# Patient Record
Sex: Female | Born: 1955 | Race: White | Hispanic: No | Marital: Married | Smoking: Never smoker
Health system: Southern US, Community
[De-identification: ages and names within clinical notes are randomized; demographics above are authoritative.]

## PROBLEM LIST (undated history)

## (undated) DIAGNOSIS — D649 Anemia, unspecified: Secondary | ICD-10-CM

## (undated) DIAGNOSIS — K219 Gastro-esophageal reflux disease without esophagitis: Secondary | ICD-10-CM

## (undated) DIAGNOSIS — R011 Cardiac murmur, unspecified: Secondary | ICD-10-CM

## (undated) DIAGNOSIS — C801 Malignant (primary) neoplasm, unspecified: Secondary | ICD-10-CM

## (undated) DIAGNOSIS — K635 Polyp of colon: Secondary | ICD-10-CM

## (undated) DIAGNOSIS — S143XXA Injury of brachial plexus, initial encounter: Secondary | ICD-10-CM

## (undated) HISTORY — DX: Cardiac murmur, unspecified: R01.1

## (undated) HISTORY — DX: Polyp of colon: K63.5

## (undated) HISTORY — DX: Malignant (primary) neoplasm, unspecified: C80.1

## (undated) HISTORY — DX: Gastro-esophageal reflux disease without esophagitis: K21.9

---
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
## 1983-04-16 HISTORY — PX: CARPAL TUNNEL RELEASE: SHX101

## 1984-04-15 HISTORY — PX: TUBAL LIGATION: SHX77

## 2005-08-08 ENCOUNTER — Ambulatory Visit: Payer: Self-pay

## 2007-03-22 ENCOUNTER — Ambulatory Visit: Payer: Self-pay | Admitting: Internal Medicine

## 2011-04-30 ENCOUNTER — Ambulatory Visit: Payer: Self-pay | Admitting: Internal Medicine

## 2011-05-02 LAB — BETA STREP CULTURE(ARMC)

## 2012-01-29 ENCOUNTER — Ambulatory Visit: Payer: Self-pay | Admitting: Family Medicine

## 2012-01-29 LAB — URINALYSIS, COMPLETE
Bilirubin,UR: NEGATIVE
Glucose,UR: NEGATIVE mg/dL (ref 0–75)
Nitrite: POSITIVE
Protein: NEGATIVE
Specific Gravity: 1.01 (ref 1.003–1.030)
WBC UR: >30 /HPF (ref 0–5)

## 2012-01-31 LAB — URINE CULTURE

## 2012-04-15 DIAGNOSIS — D649 Anemia, unspecified: Secondary | ICD-10-CM

## 2012-04-15 HISTORY — DX: Anemia, unspecified: D64.9

## 2014-12-26 ENCOUNTER — Other Ambulatory Visit: Payer: Self-pay | Admitting: Nurse Practitioner

## 2014-12-26 DIAGNOSIS — R159 Full incontinence of feces: Secondary | ICD-10-CM

## 2014-12-26 DIAGNOSIS — K6289 Other specified diseases of anus and rectum: Secondary | ICD-10-CM

## 2014-12-26 DIAGNOSIS — K625 Hemorrhage of anus and rectum: Secondary | ICD-10-CM

## 2014-12-29 ENCOUNTER — Ambulatory Visit: Payer: Self-pay

## 2015-01-04 ENCOUNTER — Ambulatory Visit: Admission: RE | Admit: 2015-01-04 | Payer: Self-pay | Source: Ambulatory Visit | Admitting: Gastroenterology

## 2015-01-04 ENCOUNTER — Encounter: Admission: RE | Payer: Self-pay | Source: Ambulatory Visit

## 2015-01-04 SURGERY — COLONOSCOPY
Anesthesia: Choice

## 2015-01-09 ENCOUNTER — Ambulatory Visit
Admission: RE | Admit: 2015-01-09 | Discharge: 2015-01-09 | Disposition: A | Discharge status: Home / Self Care | Payer: BC Managed Care – PPO | Source: Ambulatory Visit | Attending: Nurse Practitioner | Admitting: Nurse Practitioner

## 2015-01-09 DIAGNOSIS — R159 Full incontinence of feces: Secondary | ICD-10-CM

## 2015-01-09 DIAGNOSIS — K6289 Other specified diseases of anus and rectum: Secondary | ICD-10-CM | POA: Insufficient documentation

## 2015-01-09 DIAGNOSIS — K5730 Diverticulosis of large intestine without perforation or abscess without bleeding: Secondary | ICD-10-CM | POA: Diagnosis not present

## 2015-01-09 DIAGNOSIS — K625 Hemorrhage of anus and rectum: Secondary | ICD-10-CM

## 2015-01-09 IMAGING — CT CT ABD-PELV W/ CM
2 of 5 series · 16 of 46 positions shown, 18 images · IV contrast (omnipaque)
Comparison: None.

CLINICAL DATA: Rectal bleeding for 9 months. Anal pain. Fecal
incontinence.

EXAM:
CT ABDOMEN AND PELVIS WITH CONTRAST
TECHNIQUE: Multidetector CT imaging of the abdomen and pelvis was performed
using the standard protocol following bolus administration of
intravenous contrast.
CONTRAST:  100mL OMNIPAQUE IOHEXOL 350 MG/ML SOLN

[Series 2: routine with · axial · 0.70mm/px · z∈[-959,-544]mm · 13 of 95 slices shown, 15 images]
[im 6/95  soft-tissue]
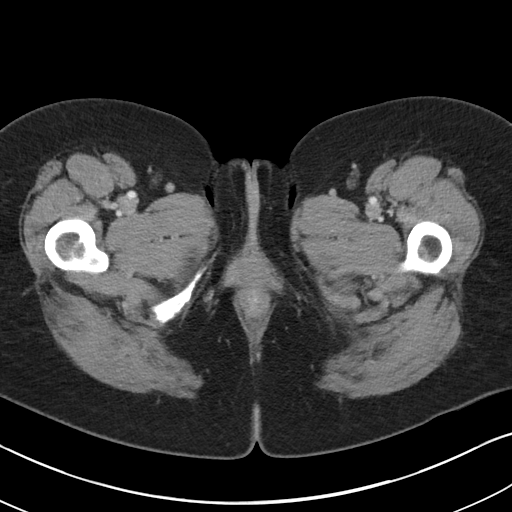
[im 6/95  bone]
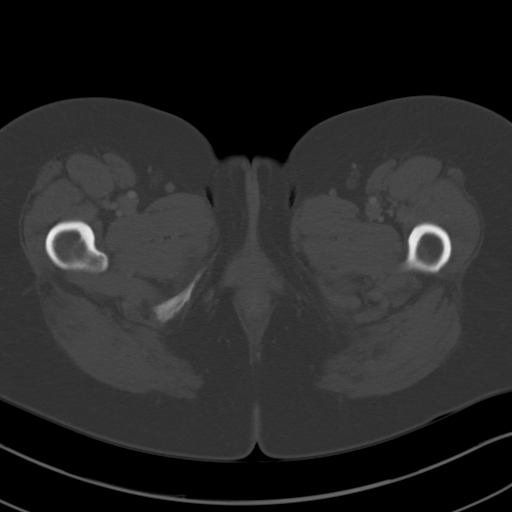
[im 11/95  soft-tissue]
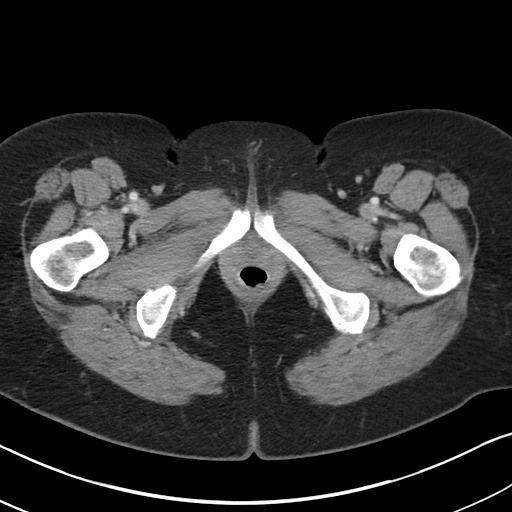
[im 21/95  soft-tissue]
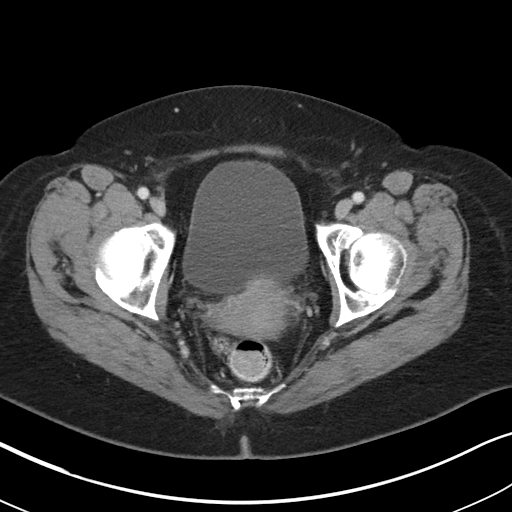
[im 27/95  soft-tissue]
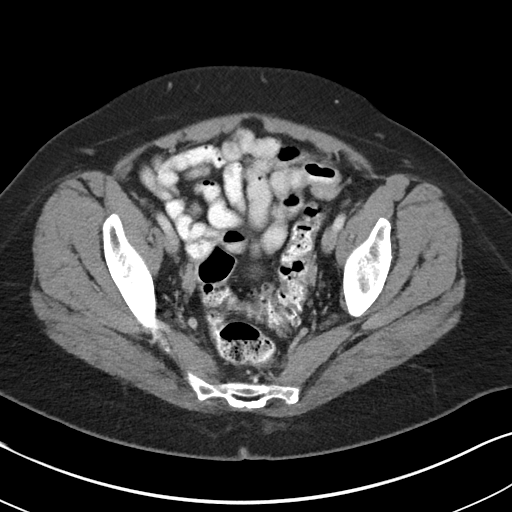
[im 32/95  soft-tissue]
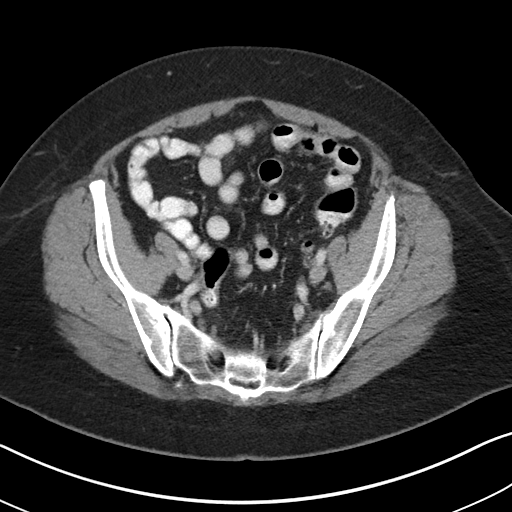
[im 42/95  soft-tissue]
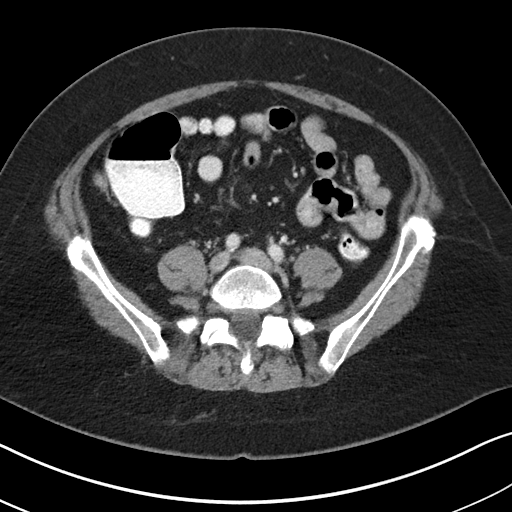
[im 48/95  soft-tissue]
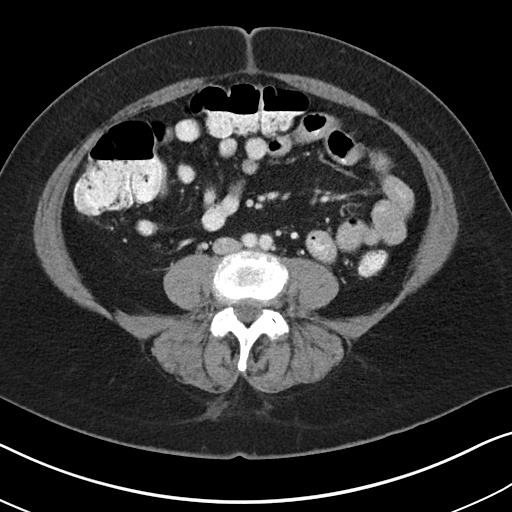
[im 53/95  soft-tissue]
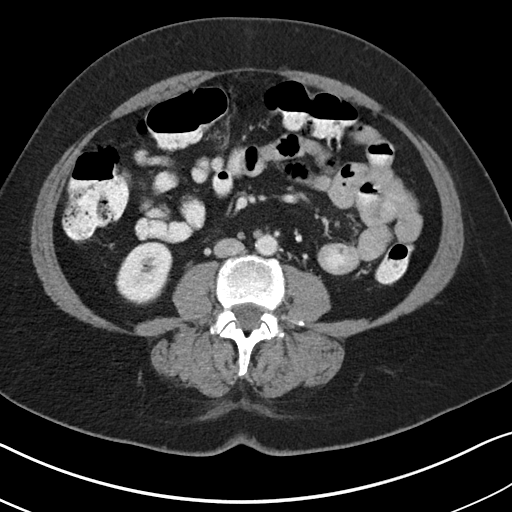
[im 63/95  soft-tissue]
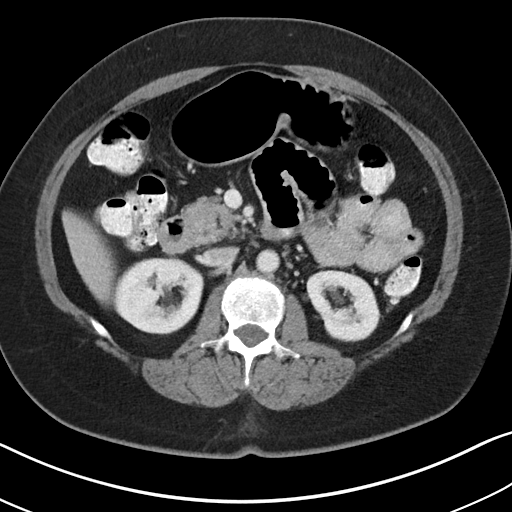
[im 63/95  bone]
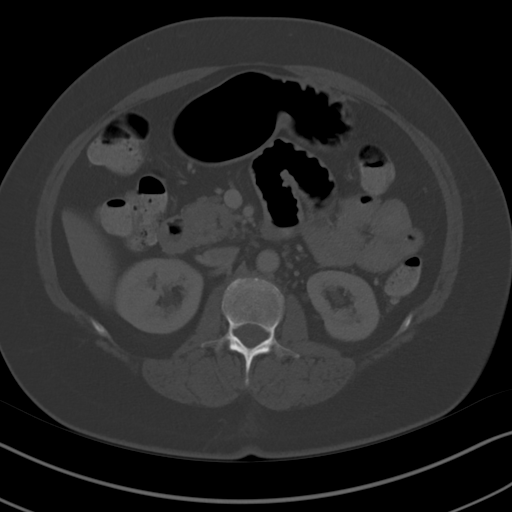
[im 68/95  soft-tissue]
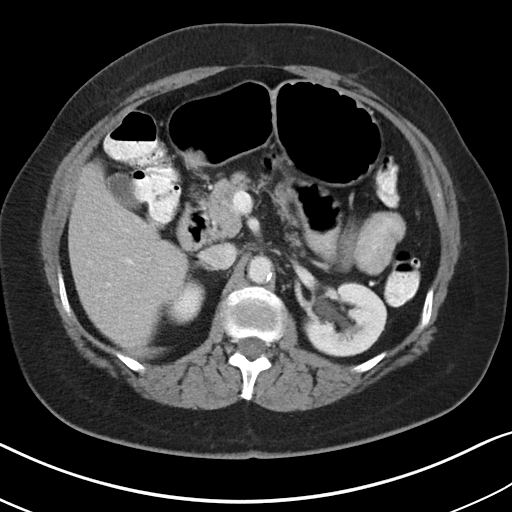
[im 74/95  soft-tissue]
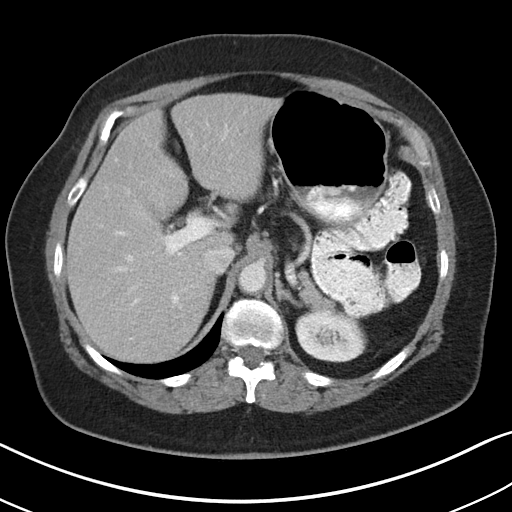
[im 84/95  soft-tissue]
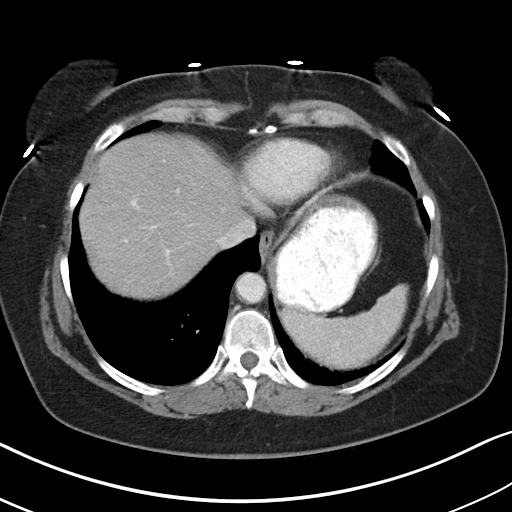
[im 89/95  soft-tissue]
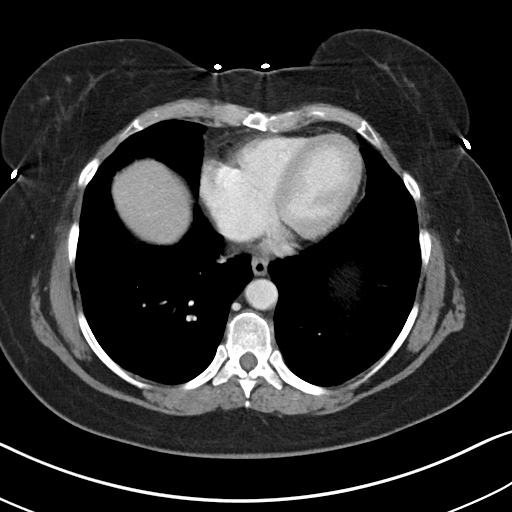

[Series 6: cor routine with · coronal · 0.74mm/px · 3 of 165 slices shown]
[im 55/165  soft-tissue]
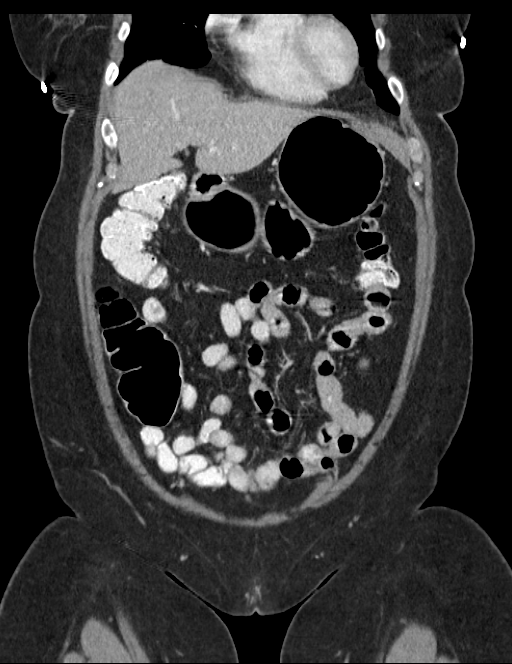
[im 73/165  soft-tissue]
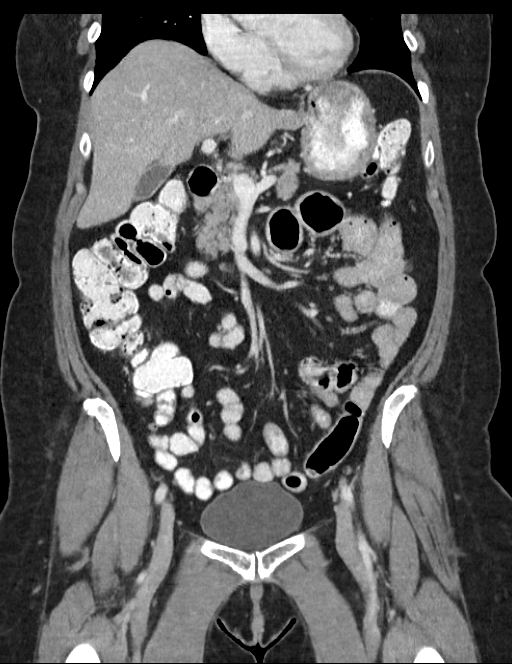
[im 92/165  soft-tissue]
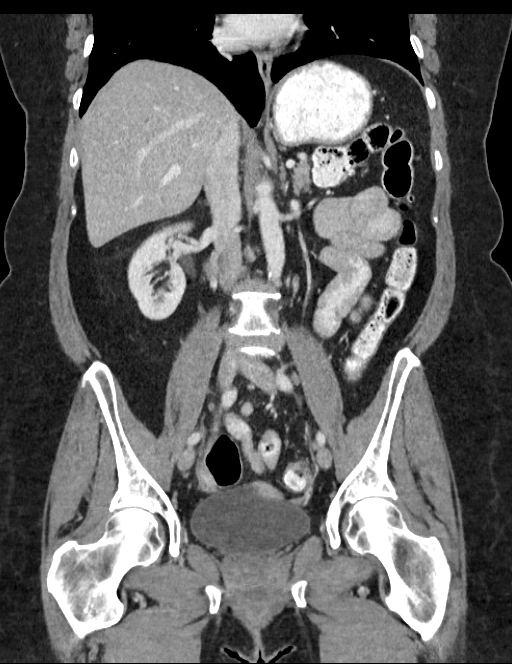

[16 of 46 positions shown; findings below may reference images not displayed]

FINDINGS: Lower chest:  No acute findings.

Hepatobiliary: No masses or other significant abnormality.
Gallbladder is unremarkable.

Pancreas: No mass, inflammatory changes, or other significant
abnormality.

Spleen: Within normal limits in size and appearance.

Adrenals/Urinary Tract: No masses identified. No evidence of
hydronephrosis.

Stomach/Bowel: No evidence of obstruction, inflammatory process, or
abnormal fluid collections. Normal appendix is visualized.
Diverticulosis is seen involving the descending and sigmoid colon,
however there is no evidence of diverticulitis. No evidence of
perirectal, perianal, or pelvic floor inflammatory changes or fluid
collections.

Vascular/Lymphatic: No pathologically enlarged lymph nodes. No
evidence of abdominal aortic aneurysm.

Reproductive: No mass or other significant abnormality.

Other: None.

Musculoskeletal:  No suspicious bone lesions identified.
IMPRESSION: Colonic diverticulosis. No radiographic evidence of diverticulitis
or other acute findings. No inflammatory changes or abnormal fluid
collections seen in perirectal or perianal regions.

## 2015-01-09 MED ORDER — IOHEXOL 350 MG/ML SOLN
100.0000 mL | Freq: Once | INTRAVENOUS | Status: AC | PRN
  Administered 2015-01-09: 100 mL via INTRAVENOUS

## 2015-04-16 DIAGNOSIS — C801 Malignant (primary) neoplasm, unspecified: Secondary | ICD-10-CM

## 2015-04-16 DIAGNOSIS — K635 Polyp of colon: Secondary | ICD-10-CM

## 2015-04-16 HISTORY — PX: OTHER SURGICAL HISTORY: SHX169

## 2015-04-16 HISTORY — DX: Polyp of colon: K63.5

## 2015-04-16 HISTORY — PX: COLON SURGERY: SHX602

## 2015-04-16 HISTORY — DX: Malignant (primary) neoplasm, unspecified: C80.1

## 2015-12-01 ENCOUNTER — Encounter: Payer: Self-pay | Admitting: *Deleted

## 2015-12-04 ENCOUNTER — Encounter
Admission: RE | Disposition: A | Discharge status: Home Health | Payer: Self-pay | Source: Ambulatory Visit | Attending: Gastroenterology

## 2015-12-04 ENCOUNTER — Encounter: Payer: Self-pay | Admitting: *Deleted

## 2015-12-04 ENCOUNTER — Ambulatory Visit: Payer: BC Managed Care – PPO | Admitting: Anesthesiology

## 2015-12-04 ENCOUNTER — Ambulatory Visit
Admission: RE | Admit: 2015-12-04 | Discharge: 2015-12-04 | Disposition: A | Discharge status: Home Health | Payer: BC Managed Care – PPO | Source: Ambulatory Visit | Attending: Gastroenterology | Admitting: Gastroenterology

## 2015-12-04 DIAGNOSIS — K625 Hemorrhage of anus and rectum: Secondary | ICD-10-CM | POA: Diagnosis present

## 2015-12-04 DIAGNOSIS — Z79899 Other long term (current) drug therapy: Secondary | ICD-10-CM | POA: Diagnosis not present

## 2015-12-04 DIAGNOSIS — R194 Change in bowel habit: Secondary | ICD-10-CM | POA: Insufficient documentation

## 2015-12-04 DIAGNOSIS — K6289 Other specified diseases of anus and rectum: Secondary | ICD-10-CM | POA: Diagnosis not present

## 2015-12-04 DIAGNOSIS — R159 Full incontinence of feces: Secondary | ICD-10-CM | POA: Diagnosis present

## 2015-12-04 DIAGNOSIS — Z87828 Personal history of other (healed) physical injury and trauma: Secondary | ICD-10-CM | POA: Diagnosis not present

## 2015-12-04 DIAGNOSIS — Z86010 Personal history of colonic polyps: Secondary | ICD-10-CM | POA: Insufficient documentation

## 2015-12-04 DIAGNOSIS — D128 Benign neoplasm of rectum: Secondary | ICD-10-CM | POA: Diagnosis not present

## 2015-12-04 HISTORY — DX: Anemia, unspecified: D64.9

## 2015-12-04 HISTORY — DX: Injury of brachial plexus, initial encounter: S14.3XXA

## 2015-12-04 HISTORY — PX: COLONOSCOPY WITH PROPOFOL: SHX5780

## 2015-12-04 SURGERY — COLONOSCOPY WITH PROPOFOL
Anesthesia: General

## 2015-12-04 MED ORDER — SODIUM CHLORIDE 0.9 % IV SOLN: INTRAVENOUS | Status: DC

## 2015-12-04 MED ORDER — PROPOFOL 500 MG/50ML IV EMUL
INTRAVENOUS | Status: DC | PRN
  Administered 2015-12-04: 150 ug/kg/min via INTRAVENOUS

## 2015-12-04 MED ORDER — SODIUM CHLORIDE 0.9 % IV SOLN
INTRAVENOUS | Status: DC
  Administered 2015-12-04: 1000 mL via INTRAVENOUS

## 2015-12-04 MED ORDER — PROPOFOL 10 MG/ML IV BOLUS
INTRAVENOUS | Status: DC | PRN
  Administered 2015-12-04: 30 mg via INTRAVENOUS
  Administered 2015-12-04: 80 mg via INTRAVENOUS

## 2015-12-04 MED ORDER — LIDOCAINE HCL (CARDIAC) 20 MG/ML IV SOLN
INTRAVENOUS | Status: DC | PRN
  Administered 2015-12-04: 30 mg via INTRAVENOUS

## 2015-12-04 NOTE — Transfer of Care (Signed)
Immediate Anesthesia Transfer of Care Note  Patient: Tonya Randolph  Procedure(s) Performed: Procedure(s): COLONOSCOPY WITH PROPOFOL (N/A)  Patient Location: Endoscopy Unit  Anesthesia Type:General  Level of Consciousness: awake and alert   Airway & Oxygen Therapy: Patient Spontanous Breathing and Patient connected to nasal cannula oxygen  Post-op Assessment: Report given to RN  Post vital signs: Reviewed and stable  Last Vitals:  Vitals:   12/04/15 0936  BP: 125/69  Pulse: 78  Resp: (!) 24  Temp: 37.1 C    Last Pain:  Vitals:   12/04/15 0936  TempSrc: Tympanic         Complications: No apparent anesthesia complications

## 2015-12-04 NOTE — Anesthesia Postprocedure Evaluation (Signed)
Anesthesia Post Note  Patient: Tonya Randolph  Procedure(s) Performed: Procedure(s) (LRB): COLONOSCOPY WITH PROPOFOL (N/A)  Patient location during evaluation: Endoscopy Anesthesia Type: General Level of consciousness: awake and alert Pain management: pain level controlled Vital Signs Assessment: post-procedure vital signs reviewed and stable Respiratory status: spontaneous breathing, nonlabored ventilation, respiratory function stable and patient connected to nasal cannula oxygen Cardiovascular status: blood pressure returned to baseline and stable Postop Assessment: no signs of nausea or vomiting Anesthetic complications: no    Last Vitals:  Vitals:   12/04/15 1158 12/04/15 1208  BP: 132/83 128/87  Pulse: 68 68  Resp: 11 17  Temp:      Last Pain:  Vitals:   12/04/15 1137  TempSrc: Tympanic                 Martha Clan

## 2015-12-04 NOTE — H&P (Signed)
Outpatient short stay form Pre-procedure 12/04/2015 10:44 AM Lollie Sails MD  Primary Physician: Dr. Thereasa Distance  Reason for visit:  Colonoscopy  History of present illness:  Patient is a 59 year old female presenting today for her first colonoscopy. He takes no blood thinning agents or aspirin products. She tolerated her prep well. She is been having issues with rectal bleeding as well as rectal discomfort and pain that may actually waken her up. This is been happening for the past 3-9 months getting worse with time. There is also been some fecal incontinence mostly of a watery nature.  Patient has had no children and has had no distal sacral injury.    Current Facility-Administered Medications:  .  0.9 %  sodium chloride infusion, , Intravenous, Continuous, Lollie Sails, MD, Last Rate: 20 mL/hr at 12/04/15 0956, 1,000 mL at 12/04/15 0956 .  0.9 %  sodium chloride infusion, , Intravenous, Continuous, Lollie Sails, MD  Prescriptions Prior to Admission  Medication Sig Dispense Refill Last Dose  . cetirizine (ZYRTEC) 10 MG tablet Take 10 mg by mouth daily.     . polyethylene glycol-electrolytes (NULYTELY/GOLYTELY) 420 g solution Take 4,000 mLs by mouth once.     . [DISCONTINUED] albuterol (PROVENTIL HFA;VENTOLIN HFA) 108 (90 Base) MCG/ACT inhaler Inhale 2 puffs into the lungs every 6 (six) hours as needed for wheezing or shortness of breath.        No Known Allergies   Past Medical History:  Diagnosis Date  . Anemia   . Brachial plexus injury     Review of systems:      Physical Exam    Heart and lungs: Regular rate and rhythm without rub or gallop, lungs are bilaterally clear.    HEENT: Normocephalic atraumatic eyes are anicteric    Other:     Pertinant exam for procedure: Soft nontender nondistended bowel sounds positive normoactive.    Planned proceedures: Colonoscopy and indicated procedures. I have discussed the risks benefits and complications  of procedures to include not limited to bleeding, infection, perforation and the risk of sedation and the patient wishes to proceed.    Lollie Sails, MD Gastroenterology 12/04/2015  10:44 AM

## 2015-12-04 NOTE — Op Note (Signed)
[facility] Patient Name: Tonya Randolph Procedure Date: 12/04/2015 10:51 AM MRN: [id number] Account #: [id number] Date of Birth: [birth year] Admit Type: Outpatient Age: 60 Room: [facility] ENDO ROOM 4 Gender: Female Note Status: Finalized Procedure:            Colonoscopy Indications:          Rectal bleeding, Change in bowel habits, Rectal pain Providers:            Lollie Sails, MD Referring MD:         [name] (Referring MD) Medicines:            Monitored Anesthesia Care Complications:        No immediate complications. Procedure:            Pre-Anesthesia Assessment:                       - ASA Grade Assessment: I - A normal, healthy patient.                       After obtaining informed consent, the colonoscope was                        passed under direct vision. Throughout the procedure,                        the patient's blood pressure, pulse, and oxygen                        saturations were monitored continuously. The                        Colonoscope was introduced through the anus and                        advanced to the the cecum, identified by appendiceal                        orifice and ileocecal valve. The colonoscopy was                        performed without difficulty. The patient tolerated the                        procedure well. The quality of the bowel preparation                        was good. Findings:      A polypoid partially obstructing large mass was found at 15 cm proximal       to the anus. The mass measured four cm in length. Lesion is friable. It       was relatively difficult to pass the lesion with the scope. The base was       very diffucult to ascertain but seemed broad with multiple larger       vessels. Biopsies were taken with a cold forceps for histology.      The digital rectal exam was normal. Impression:           - Rule out malignancy, partially obstructing tumor at  15 cm proximal to the anus. Biopsied. Recommendation:       - Discharge patient to home.                       - Await pathology results. Lollie Sails, MD 12/04/2015 11:40:22 AM This report has been signed electronically. Number of Addenda: 0 Note Initiated On: 12/04/2015 10:51 AM Scope Withdrawal Time: 0 hours 11 minutes 38 seconds  Total Procedure Duration: 0 hours 25 minutes 2 seconds       [facility]

## 2015-12-04 NOTE — Anesthesia Preprocedure Evaluation (Signed)
Anesthesia Evaluation  Patient identified by MRN, date of birth, ID band Patient awake    Reviewed: Allergy & Precautions, H&P , NPO status , Patient's Chart, lab work & pertinent test results, reviewed documented beta blocker date and time   History of Anesthesia Complications Negative for: history of anesthetic complications  Airway Mallampati: III  TM Distance: >3 FB Neck ROM: full    Dental no notable dental hx. (+) Caps, Teeth Intact   Pulmonary neg pulmonary ROS,    Pulmonary exam normal breath sounds clear to auscultation       Cardiovascular Exercise Tolerance: Good negative cardio ROS Normal cardiovascular exam Rhythm:regular Rate:Normal     Neuro/Psych neg Seizures  Neuromuscular disease (brachial plexus injury) negative psych ROS   GI/Hepatic negative GI ROS, Neg liver ROS,   Endo/Other  negative endocrine ROS  Renal/GU negative Renal ROS  negative genitourinary   Musculoskeletal   Abdominal   Peds  Hematology  (+) Blood dyscrasia, anemia ,   Anesthesia Other Findings Past Medical History: No date: Anemia No date: Brachial plexus injury   Reproductive/Obstetrics negative OB ROS                             Anesthesia Physical Anesthesia Plan  ASA: I  Anesthesia Plan: General   Post-op Pain Management:    Induction:   Airway Management Planned:   Additional Equipment:   Intra-op Plan:   Post-operative Plan:   Informed Consent: I have reviewed the patients History and Physical, chart, labs and discussed the procedure including the risks, benefits and alternatives for the proposed anesthesia with the patient or authorized representative who has indicated his/her understanding and acceptance.   Dental Advisory Given  Plan Discussed with: Anesthesiologist, CRNA and Surgeon  Anesthesia Plan Comments:         Anesthesia Quick Evaluation

## 2015-12-05 ENCOUNTER — Encounter: Payer: Self-pay | Admitting: Gastroenterology

## 2015-12-05 LAB — SURGICAL PATHOLOGY

## 2015-12-13 ENCOUNTER — Encounter: Payer: Self-pay | Admitting: *Deleted

## 2015-12-21 ENCOUNTER — Ambulatory Visit (INDEPENDENT_AMBULATORY_CARE_PROVIDER_SITE_OTHER): Payer: BC Managed Care – PPO | Admitting: General Surgery

## 2015-12-21 ENCOUNTER — Encounter: Payer: Self-pay | Admitting: General Surgery

## 2015-12-21 VITALS — BP 134/72 | HR 76 | Resp 12 | Ht 65.0 in | Wt 168.0 lb

## 2015-12-21 DIAGNOSIS — K635 Polyp of colon: Secondary | ICD-10-CM | POA: Diagnosis not present

## 2015-12-21 DIAGNOSIS — Z86010 Personal history of colonic polyps: Secondary | ICD-10-CM | POA: Diagnosis not present

## 2015-12-21 NOTE — Patient Instructions (Addendum)
Flexible Sigmoidoscopy Flexible sigmoidoscopy is a procedure to check your lower colon (sigmoid colon). The procedure is done using a short, flexible tube that has a tiny camera attached (sigmoidoscope). The sigmoidoscope is inserted into your anus and passed through your rectum into your sigmoid colon. The camera on the scope sends images to a television monitor.  You may need this exam if you have had changes in your bowel habits, bleeding from your rectum, or abdominal pain. You may also need this test if your health care provider wants to check for abnormal growths in your rectum or lower colon. LET [facility] CARE PROVIDER KNOW ABOUT:  Any allergies you have.  All medicines you are taking, including vitamins, herbs, eye drops, creams, and over-the-counter medicines.  Previous problems you or members of your family have had with the use of anesthetics.  Any blood disorders you have.  Previous surgeries you have had.  Medical conditions you have. RISKS AND COMPLICATIONS Generally, this is a safe procedure. However, as with any procedure, problems can occur. Possible problems include:  Abdominal pain.  Bleeding.  Infection or inflammation in your colon.  A tear through your rectum or colon (perforation). BEFORE THE PROCEDURE  You will need to follow a special diet and use a laxative or an enema before the procedure (bowel prep). This is to clean out your colon. You may need to start your bowel prep 1-3 days before the procedure. Follow your health care provider's instructions exactly.  Do not eat or drink anything after midnight the night before the procedure or as directed by your health care provider.  You may need to have a rectal suppository or enema in the morning before your procedure.  Arrange for someone to take you home after the procedure. PROCEDURE  Sigmoidoscopy takes about 20 minutes and may include the following steps:  You may get a medicine through an IV tube  to make you relaxed and drowsy (sedation).  You will lie on your side on the examination table.  The sigmoidoscope will be lubricated and gently inserted into your anus.  Air will be injected into your colon and the sigmoidoscope will be moved into your sigmoid colon. You may feel some pressure or cramping.  You may be asked to change positions at times during the procedure.  Your health care provider will check the monitor for any abnormal findings.  Your health care provider may also want to take a small piece of tissue to check under a microscope (biopsy).  Your health care provider takes a final look at your sigmoid colon and rectum as the scope is slowly removed. AFTER THE PROCEDURE  You will stay in a recovery area for a short while until your health care provider says you can go home.   This information is not intended to replace advice given to you by your health care provider. Make sure you discuss any questions you have with your health care provider.   Document Released: 03/29/2000 Document Revised: 04/06/2013 Document Reviewed: 03/04/2013 Elsevier Interactive Patient Education [employer].  The patient is scheduled for an in office rigid sigmoidoscopy for 01/01/16 at 8:30 am. She will have surgery at [facility] on 01/12/16. She will pre admit by phone. She was given instructions for bowel prep pre surgery. The patient is aware of dates, time, and instructions.

## 2015-12-21 NOTE — Progress Notes (Signed)
Patient ID: Tonya Randolph, female   DOB: 1955-12-13, 60 y.o.   MRN: [id number]  Chief Complaint  Patient presents with  . Other    colon mass    HPI Tonya Randolph is a 60 y.o. female here today for evaluation for removal of adenocarcinoma found on colonoscopy done on 12/04/15 Dr. Gustavo Lah. She states she had been having diarrhea for two years she did notice blood in the toilet and blood when wiping. She also admits to seepage, moves her bowels about 12 times a day. She does admit to having rectal pain intermittently for two years, she states the pain will wake her up at night. She reports the colonoscopy Described internal hemorrhoids but was not discussed with her. She has been having a lot of acid reflux for the past two months. She feels a choking feeling laying down at night. Denies fevers. She states for the past year she has been trying to loose weight by changing her diet recommended by Dr. Santiago Glad. She states her highest weight was 199 lb. Her husband Tonya Randolph is present. They have been married for 31 years. She works as an Scientist, physiological at [employer]. The patient waited a year to describe her symptoms to her physician about rectal bleeding and change in stool habits, and then initial plans were for an endoscopy at an outpatient center, but this was postponed on several occasions. Just recently she underwent the procedure at [facility].  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    HPI  Past Medical History:  Diagnosis Date  . Anemia 2014  . Brachial plexus injury   . 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 Colon polyp 2017  . GERD (gastroesophageal reflux disease)     Past Surgical History:  Procedure Laterality Date  . basal cell removal  2017   left arm  . CARPAL TUNNEL RELEASE  1985  . COLONOSCOPY WITH PROPOFOL N/A 12/04/2015   Procedure: COLONOSCOPY WITH PROPOFOL;  Surgeon: Lollie Sails, MD;  Location:  [facility] ENDOSCOPY;  Service: Endoscopy;  Laterality: N/A;  . TONSILLECTOMY  1973  . TUBAL LIGATION  1986    Family History  Problem Relation Age of Onset  . Cancer Mother     ovarian    Social History Social History  Substance Use Topics  . Smoking status: Never Smoker  . Smokeless tobacco: Never Used  . Alcohol use Yes    No Known Allergies  Current Outpatient Prescriptions  Medication Sig Dispense Refill  . cetirizine (ZYRTEC) 10 MG tablet Take 10 mg by mouth daily.    Marland Kitchen omeprazole (PRILOSEC) 20 MG capsule Take 20 mg by mouth daily.     No current facility-administered medications for this visit.     Review of Systems Review of Systems  Constitutional: Negative.  Negative for unexpected weight change.  HENT: Negative.   Eyes: Negative.   Respiratory: Negative.  Negative for shortness of breath.   Cardiovascular: Negative.   Gastrointestinal: Positive for anal bleeding, blood in stool and rectal pain. Negative for abdominal pain.  Endocrine: Negative.   Genitourinary: Negative.   Musculoskeletal: Negative.   Skin: Negative.   Allergic/Immunologic: Negative.   Neurological: Negative.   Hematological: Negative.   Psychiatric/Behavioral: Negative.     Blood pressure 134/72, pulse 76, resp. rate 12, height 5\' 5"  (1.651 m), weight 168 lb (76.2 kg).  Physical Exam Physical Exam  Constitutional: She is oriented to  person, place, and time. She appears well-developed and well-nourished.  HENT:  Head: Normocephalic and atraumatic.  Eyes: Conjunctivae are normal. Pupils are equal, round, and reactive to light. No scleral icterus.  Neck: Normal range of motion. Neck supple. No thyromegaly present.  Cardiovascular: Normal rate, regular rhythm and normal heart sounds.   Pulmonary/Chest: Effort normal and breath sounds normal.  Abdominal: Soft. Bowel sounds are normal.  Genitourinary: Rectal exam shows no external hemorrhoid, no internal hemorrhoid, no fissure, no mass, no  tenderness, anal tone normal and guaiac negative stool.  Musculoskeletal: Normal range of motion.  Neurological: She is alert and oriented to person, place, and time.  Skin: Skin is warm and dry.  Psychiatric: She has a normal mood and affect. Her behavior is normal. Judgment and thought content normal.    Data Reviewed Colonoscopy of 12/04/2015 reviewed. Report describes a polypoid mass at 15 cm, partially obstructing, 4 cm in length.  Pathology:DIAGNOSIS:  A. POLYPOID MASS, RECTUM; COLD BIOPSY:  -TUBULOVILLOUS ADENOMA WITH HIGH GRADE DYSPLASIA.   12/11/2015 laboratory studies show white blood cell count of 8000, hemoglobin of 12.6, unchanged over the last 2 years, MCV of 95. Platelet count 231,000.  Comprehensive metabolic panel of the same date was normal.  Assessment    History of rectal bleeding without identification of sizable polyp at 15 cm.    Plan    Sigmoidoscopy recommended to confirm distance from the sphincter to the polyp. At the 15+ centimeter range low anterior resection converted be expected to provide excellent removal if malignancy is identified and anticipate fairly normal bowel function, perhaps one extra stool per day. If a mid or lower rectal lesion is identified, the patient may benefit from robotic prostatectomy.  Opportunity to be evaluated at a facility where total laparoscopic/robotic procedures are routinely performed was discussed. 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       Patient advised of recommendation of sigmoidoscopy to patient for further evaluation of polyp.  Discussed risks and benefits to patient.   Information regarding the procedure, including its potential risks and complications (including but not limited to perforation of the bowel, which may require emergency surgery to repair, and bleeding) was verbally given to the patient. Educational information regarding lower intestinal endoscopy was  given to the patient. Written instructions for how to complete the bowel prep using Miralax were provided. The importance of drinking ample fluids to avoid dehydration as a result of the prep emphasized.  Plan to be in hospital estimate of 3 days. Importance of walking post surgery was advised.   Estimate 10-14 days out of work.  The patient is scheduled for an in office rigid sigmoidoscopy for 01/01/16 at 8:30 am. She will have surgery at [facility] on 01/12/16. She will pre admit by phone. She was given instructions for bowel prep pre surgery. The patient is aware of dates, time, and instructi  We spent approximately approximately 50 minutes reviewing options for management.  12/23/2015, 12:44 PM

## 2016-01-01 ENCOUNTER — Ambulatory Visit (INDEPENDENT_AMBULATORY_CARE_PROVIDER_SITE_OTHER): Payer: BC Managed Care – PPO | Admitting: General Surgery

## 2016-01-01 VITALS — BP 138/70 | HR 78 | Resp 13 | Ht 65.0 in | Wt 167.0 lb

## 2016-01-01 DIAGNOSIS — K635 Polyp of colon: Secondary | ICD-10-CM

## 2016-01-01 MED ORDER — POLYETHYLENE GLYCOL 3350 17 GM/SCOOP PO POWD: 1.0000 | Freq: Once | ORAL | 0 refills | Status: AC

## 2016-01-01 MED ORDER — NEOMYCIN SULFATE 500 MG PO TABS
500.0000 mg | ORAL_TABLET | ORAL | 0 refills | Status: DC
Start: 2016-01-01 — End: 2016-01-12

## 2016-01-01 MED ORDER — METRONIDAZOLE 500 MG PO TABS: 500.0000 mg | ORAL_TABLET | ORAL | 0 refills | Status: DC

## 2016-01-01 NOTE — Progress Notes (Signed)
Patient ID: Tonya Randolph, female   DOB: 25-Jun-1955, 60 y.o.   MRN: [id number]  Chief Complaint  Patient presents with  . Procedure    Rigid Sigmoidoscopy    HPI Tonya Randolph is a 60 y.o. female here for a scheduled rigid sigmoidoscopy. She is also scheduled for surgery at [facility] on 01/12/16. HPI  Past Medical History:  Diagnosis Date  . Anemia 2014  . Brachial plexus injury   . 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 Colon polyp 2017  . GERD (gastroesophageal reflux disease)     Past Surgical History:  Procedure Laterality Date  . basal cell removal  2017   left arm  . CARPAL TUNNEL RELEASE  1985  . COLONOSCOPY WITH PROPOFOL N/A 12/04/2015   Procedure: COLONOSCOPY WITH PROPOFOL;  Surgeon: Lollie Sails, MD;  Location: [facility] ENDOSCOPY;  Service: Endoscopy;  Laterality: N/A;  . TONSILLECTOMY  1973  . TUBAL LIGATION  1986    Family History  Problem Relation Age of Onset  . Cancer Mother     ovarian    Social History Social History  Substance Use Topics  . Smoking status: Never Smoker  . Smokeless tobacco: Never Used  . Alcohol use Yes    No Known Allergies  Current Outpatient Prescriptions  Medication Sig Dispense Refill  . cetirizine (ZYRTEC) 10 MG tablet Take 10 mg by mouth daily.    . metroNIDAZOLE (FLAGYL) 500 MG tablet Take 1 tablet (500 mg total) by mouth See admin instructions. Take 1 tablet at 6 pm and take 1 tablet at 11 pm on 01/11/16. 2 tablet 0  . neomycin (MYCIFRADIN) 500 MG tablet Take 1 tablet (500 mg total) by mouth See admin instructions. Take 2 tablets at 6 pm, then take 2 tablets at 11 pm on 01/11/16. 4 tablet 0  . omeprazole (PRILOSEC) 20 MG capsule Take 20 mg by mouth daily.    . polyethylene glycol powder (GLYCOLAX/MIRALAX) powder Take 255 g by mouth once. 255 g 0   No current facility-administered medications for this visit.     Review of Systems Review of Systems  Constitutional: Negative.   Respiratory: Negative.    Cardiovascular: Negative.     Blood pressure 138/70, pulse 78, resp. rate 13, height 5\' 5"  (1.651 m), weight 167 lb (75.8 kg).  Physical Exam Physical Exam  Constitutional: She is oriented to person, place, and time. She appears well-developed and well-nourished.  Genitourinary:  Genitourinary Comments: The rigid sigmoidoscope was advanced to 12 cm were the polyp identified at her most recent colonoscopy was located. This appeared smaller than on the endoscopic images. Intervening area was clear.  Neurological: She is alert and oriented to person, place, and time.  Skin: Skin is warm and dry.  Psychiatric: She has a normal mood and affect.    Data Reviewed Pathology showed a tubulovillous polyp with high-grade dysplasia. The gastric urologists described a lesion 4 cm in length in the photograph suggested it encompassed the third of the diameter. Much less impressive on today's rigid sigmoidoscopic exam.  Assessment    Candidate for low anterior resection and re-anastomosis.    Plan    The surgical procedure was reviewed. Postoperative expectations discussed. Importance of early ambulation reviewed.    The patient is scheduled for surgery at [facility] on 01/12/16. She will pre admit by phone. The patient is aware of date and instructions.  This has been scribed by Lesly Rubenstein LPN  Robert Bellow 01/01/2016, 10:10 PM

## 2016-01-01 NOTE — Patient Instructions (Signed)
The patient is scheduled for surgery at [facility] on 01/12/16. She will pre admit by phone. The patient is aware of date and instructions.

## 2016-01-03 ENCOUNTER — Encounter
Admission: RE | Admit: 2016-01-03 | Discharge: 2016-01-03 | Disposition: A | Discharge status: Home / Self Care | Payer: BC Managed Care – PPO | Source: Ambulatory Visit | Attending: General Surgery | Admitting: General Surgery

## 2016-01-03 DIAGNOSIS — Z01818 Encounter for other preprocedural examination: Secondary | ICD-10-CM | POA: Insufficient documentation

## 2016-01-03 LAB — SURGICAL PCR SCREEN
MRSA, PCR: NEGATIVE
STAPHYLOCOCCUS AUREUS: NEGATIVE

## 2016-01-03 NOTE — Patient Instructions (Signed)
  Your procedure is scheduled on: 01-12-16 (FRIDAY) Report to Same Day Surgery 2nd floor medical mall To find out your arrival time please call [phone] between 1PM - 3PM on 01-11-16 (THURSDAY)  Remember: Instructions that are not followed completely may result in serious medical risk, up to and including death, or upon the discretion of your surgeon and anesthesiologist your surgery may need to be rescheduled.    _x___ 1. Do not eat food or drink liquids after midnight. No gum chewing or hard candies.     __x__ 2. No Alcohol for 24 hours before or after surgery.   __x__3. No Smoking for 24 prior to surgery.   ____  4. Bring all medications with you on the day of surgery if instructed.    __x__ 5. Notify your doctor if there is any change in your medical condition     (cold, fever, infections).     Do not wear jewelry, make-up, hairpins, clips or nail polish.  Do not wear lotions, powders, or perfumes. You may wear deodorant.  Do not shave 48 hours prior to surgery. Men may shave face and neck.  Do not bring valuables to the hospital.    [facility] is not responsible for any belongings or valuables.               Contacts, dentures or bridgework may not be worn into surgery.  Leave your suitcase in the car. After surgery it may be brought to your room.  For patients admitted to the hospital, discharge time is determined by your treatment team.   Patients discharged the day of surgery will not be allowed to drive home.    Please read over the following fact sheets that you were given:   [facility] Preparing for Surgery and or MRSA Information   _x___ Take these medicines the morning of surgery with A SIP OF WATER:    1. PRILOSEC  2. TAKE AN EXTRA PRILOSEC AT BEDTIME THE NIGHT BEFORE SURGERY  3.   4.  5.  6.  ____Fleets enema or Magnesium Citrate as directed.   _x___ Use CHG Soap or sage wipes as directed on instruction sheet   ____ Use inhalers on the day of surgery  and bring to hospital day of surgery  ____ Stop metformin 2 days prior to surgery    ____ Take 1/2 of usual insulin dose the night before surgery and none on the morning of  surgery.   ____ Stop aspirin or coumadin, or plavix  __    Stop Anti-inflammatories such as Advil, Aleve, Ibuprofen, Motrin, Naproxen,          Naprosyn, Goodies powders or aspirin products. Ok to take Tylenol.   ____ Stop supplements until after surgery.    ____ Bring C-Pap to the hospital.

## 2016-01-03 NOTE — Pre-Procedure Instructions (Signed)
Tonya Levans, MD - 04/28/2015 10:15 AM EST Formatting of this note may be different from the original.   Chief Complaint: Chief Complaint  Patient presents with  . Chest Pain  new pt per Tonya Randolph happened x1 at work on wednesday chest radiated to to jaw  . Fatigue  yesterday felt a little tired  . Heart Murmur  born with it  Date of Service: 04/28/2015 Date of Birth: 1955-04-30 PCP: Tonya Socks, MD  History of Present Illness: Tonya Randolph is a 60 y.o.female patient who presents in referral for evaluation of chest and jaw pain. Patient has no prior cardiac history. She has no current risk factors. She denies hypertension. Her LDL is borderline at 131 with an HDL of 56. She denies diabetes, tobacco abuse or family history of heart disease. She had an episode of midsternal chest pain radiating to her neck and jaw lasting approximately 5-10 minutes. It resolved on its own. She was unaware of any arrhythmia that occurred along with it. She has not had it since or previously. EKG done in her primary care provider's office showed sinus rhythm at a rate of 87 with a PR interval of 138 ms, QRS duration of 78 ms with a QTC of 440 ms and a QRS axis of -12. There was no ischemia. He is able to ambulate without difficulty. She is not under any particular stress at present.  Past Medical and Surgical History  Past Medical History Past Medical History  Diagnosis Date  . Anemia  . Brachial plexus injury   Past Surgical History She has a past surgical history that includes Endoscopic Carpal Tunnel Release.   Medications and Allergies  Current Medications  Current Outpatient Prescriptions  Medication Sig Dispense Refill  . cyclobenzaprine (FLEXERIL) 10 MG tablet Take 1 tablet (10 mg total) by mouth nightly as needed. 30 tablet 0  . gabapentin (NEURONTIN) 300 MG capsule Take 1 tablet at bedtime for 3 days, then take 1 tablet in the afternoon and 1 tablet at night for 3 days,  then 1 tablet 3 times a day. 30 capsule 0   No current facility-administered medications for this visit.   Allergies: Review of patient's allergies indicates no known allergies.  Social and Family History  Social History reports that she has never smoked. She has never used smokeless tobacco. She reports that she drinks alcohol. She reports that she does not use illicit drugs.  Family History Family History  Problem Relation Age of Onset  . Ovarian cancer Other   Review of Systems  Review of Systems  Constitutional: Negative for chills, diaphoresis, fever, malaise/fatigue and weight loss.  HENT: Negative for congestion, ear discharge, hearing loss and tinnitus.  Eyes: Negative for blurred vision.  Respiratory: Negative for cough, hemoptysis, sputum production, shortness of breath and wheezing.  Cardiovascular: Positive for chest pain. Negative for palpitations, orthopnea, claudication, leg swelling and PND.  Gastrointestinal: Negative for abdominal pain, blood in stool, constipation, diarrhea, heartburn, melena, nausea and vomiting.  Genitourinary: Negative for dysuria, frequency, hematuria and urgency.  Musculoskeletal: Negative for back pain, falls, joint pain and myalgias.  Skin: Negative for itching and rash.  Neurological: Negative for dizziness, tingling, focal weakness, loss of consciousness, weakness and headaches.  Endo/Heme/Allergies: Negative for polydipsia. Does not bruise/bleed easily.  Psychiatric/Behavioral: Negative for depression, memory loss and substance abuse. The patient is not nervous/anxious.   Physical Examination   Vitals: Visit Vitals  . BP 132/80 (BP Location: Left upper arm, Patient  Position: Sitting, BP Cuff Size: Adult)  . Pulse 60  . Resp 12  . Ht 157.5 cm (5\' 2" )  . Wt 82.5 kg (181 lb 12.8 oz)  . BMI 33.25 kg/m2   Ht:157.5 cm (5\' 2" ) Wt:82.5 kg (181 lb 12.8 oz) [id number] surface area is 1.9 meters squared. Body mass index is 33.25  kg/(m^2).  Wt Readings from Last 3 Encounters:  04/28/15 82.5 kg (181 lb 12.8 oz)  04/27/15 83 kg (183 lb)  04/21/15 89.4 kg (197 lb)   BP Readings from Last 3 Encounters:  04/28/15 132/80  04/27/15 122/82  04/21/15 126/67   General appearance appears in no acute distress  Head Mouth and Eye exam Normocephalic, without obvious abnormality, atraumatic Dentition is good Eyes appear anicteric   Neck exam Thyroid: normal  Nodes: no obvious adenopathy  LUNGS Breath Sounds: Normal Percussion: Normal  CARDIOVASCULAR JVP CV wave: no HJR: no Elevation at 90 degrees: None Carotid Pulse: normal pulsation bilaterally Bruit: None Apex: apical impulse normal  Auscultation Rhythm: normal sinus rhythm S1: normal S2: normal Clicks: no Rub: no Murmurs: 1 to 2/6 systolic murmur radiating to the left lower sternal border Gallop: None ABDOMEN Liver enlargement: no Pulsatile aorta: no Ascites: no Bruits: no  EXTREMITIES Clubbing: no Edema: trace to 1+ bilateral pedal edema Pulses: peripheral pulses symmetrical Femoral Bruits: no Amputation: no SKIN Rash: no Cyanosis: no Embolic phemonenon: no Bruising: no NEURO Alert and Oriented to person, place and time: yes Non focal: yes  PSYCH: Pt appears to have normal affect  LABS REVIEWED Last 3 CBC results: Lab Results  Component Value Date  WBC 7.0 12/01/2014  WBC 5.5 12/03/2013   Lab Results  Component Value Date  HGB 13.3 12/01/2014  HGB 12.6 12/03/2013   Lab Results  Component Value Date  HCT 39.4 12/01/2014  HCT 36.9 12/03/2013   Lab Results  Component Value Date  PLT 245 12/01/2014  PLT 238 12/03/2013   Lab Results  Component Value Date  CREATININE 0.7 12/01/2014  BUN 10 12/01/2014  NA 140 12/01/2014  K 4.8 12/01/2014  CL 106 12/01/2014  CO2 29.4 12/01/2014   Lab Results  Component Value Date  HDL 56.0 12/01/2014  HDL 54.9 12/03/2013   Lab Results  Component Value Date  LDLCALC 131  (H) 12/01/2014  LDLCALC 122 12/03/2013   Lab Results  Component Value Date  TRIG 102 12/01/2014  TRIG 109 12/03/2013   Lab Results  Component Value Date  ALT 19 12/01/2014  AST 19 12/01/2014  ALKPHOS 76 12/01/2014   Lab Results  Component Value Date  TSH 0.558 12/01/2014   Diagnostic Studies Reviewed:  EKG EKG demonstrated normal EKG, normal sinus rhythm.  Assessment and Plan   60 y.o. female with  ICD-10-CM ICD-9-CM  1. Atypical chest pain-chest pain with both typical and atypical features. Electrocardiogram is normal. We will need to evaluate for evidence of possible ischemic etiology with an ETT. Proceed with an ETT and make further recommendations after this is complete. R07.89 786.59 CARD stress test only, exercise  2. Acute pain of left shoulder-as per above evaluating for evidence of ischemic etiology. M25.512 719.41   Return if symptoms worsen or fail to improve.  These notes generated with voice recognition software. I apologize for typographical errors.  Tonya Levans, MD

## 2016-01-08 ENCOUNTER — Telehealth: Payer: Self-pay | Admitting: *Deleted

## 2016-01-08 NOTE — Telephone Encounter (Signed)
Patient is calling you back regarding surgery. She stated to call her on her work phone today.

## 2016-01-09 NOTE — Telephone Encounter (Signed)
I contacted the patient regarding a possible discrepancy in the size of the polyp previously identified and colonoscopy and reassessed recently on rigid sigmoidoscopy. If indeed the lesion identified on rigid sigmoidoscopy 12 cm represents the index lesion identified at the time of her colonoscopy, this may be amenable to endoscopic resection. I encouraged her to consider a flexible sigmoidoscopy was sedation prior to her planned surgical date. This could potentially obviate the need for a colon resection and if indeed the polyp was able to be removed and found to have invasive malignancy at the base make it unnecessary for a second formal prep and a second trip to the operating room with associated anesthesia.  She reports that with her work schedule and trying to get things squared away at the [facility] ophthalmology Department before she is out, she will did not want to have a separate procedure.  Plans at present are for colonoscopy at the beginning of her planned colon resection for confirmation of lesion rotation and possible endoscopic resection.  I've spoken with the patient had a pathology, frozen section analysis is not felt appropriate to determine the aorta and a on formal colon resection and any procedure will be deferred to permanent sectioning.

## 2016-01-12 ENCOUNTER — Inpatient Hospital Stay: Payer: BC Managed Care – PPO | Admitting: Anesthesiology

## 2016-01-12 ENCOUNTER — Ambulatory Visit
Admission: RE | Admit: 2016-01-12 | Discharge: 2016-01-12 | Disposition: A | Discharge status: Home / Self Care | Payer: BC Managed Care – PPO | Source: Ambulatory Visit | Attending: General Surgery | Admitting: General Surgery

## 2016-01-12 ENCOUNTER — Encounter: Payer: Self-pay | Admitting: *Deleted

## 2016-01-12 ENCOUNTER — Encounter
Admission: RE | Disposition: A | Discharge status: Home / Self Care | Payer: Self-pay | Source: Ambulatory Visit | Attending: General Surgery

## 2016-01-12 DIAGNOSIS — D126 Benign neoplasm of colon, unspecified: Secondary | ICD-10-CM | POA: Diagnosis not present

## 2016-01-12 DIAGNOSIS — K635 Polyp of colon: Secondary | ICD-10-CM

## 2016-01-12 DIAGNOSIS — D127 Benign neoplasm of rectosigmoid junction: Secondary | ICD-10-CM | POA: Diagnosis not present

## 2016-01-12 DIAGNOSIS — K219 Gastro-esophageal reflux disease without esophagitis: Secondary | ICD-10-CM | POA: Insufficient documentation

## 2016-01-12 DIAGNOSIS — K621 Rectal polyp: Secondary | ICD-10-CM | POA: Diagnosis present

## 2016-01-12 HISTORY — PX: COLONOSCOPY: SHX5424

## 2016-01-12 SURGERY — COLONOSCOPY
Anesthesia: General | Wound class: Clean Contaminated

## 2016-01-12 MED ORDER — GABAPENTIN 300 MG PO CAPS
300.0000 mg | ORAL_CAPSULE | ORAL | Status: AC
  Administered 2016-01-12: 300 mg via ORAL

## 2016-01-12 MED ORDER — ONDANSETRON HCL 4 MG/2ML IJ SOLN: 4.0000 mg | Freq: Once | INTRAMUSCULAR | Status: DC | PRN

## 2016-01-12 MED ORDER — LIDOCAINE 2% (20 MG/ML) 5 ML SYRINGE
INTRAMUSCULAR | Status: DC | PRN
  Administered 2016-01-12: 50 mg via INTRAVENOUS

## 2016-01-12 MED ORDER — MIDAZOLAM HCL 5 MG/5ML IJ SOLN
INTRAMUSCULAR | Status: DC | PRN
  Administered 2016-01-12: 2 mg via INTRAVENOUS

## 2016-01-12 MED ORDER — PHENYLEPHRINE HCL 10 MG/ML IJ SOLN
INTRAMUSCULAR | Status: DC | PRN
  Administered 2016-01-12 (×4): 100 ug via INTRAVENOUS

## 2016-01-12 MED ORDER — ALVIMOPAN 12 MG PO CAPS
ORAL_CAPSULE | ORAL | Status: AC
  Administered 2016-01-12: 12 mg via ORAL
  Filled 2016-01-12: qty 1

## 2016-01-12 MED ORDER — LACTATED RINGERS IV SOLN
INTRAVENOUS | Status: DC
  Administered 2016-01-12: 07:00:00 via INTRAVENOUS

## 2016-01-12 MED ORDER — PROPOFOL 10 MG/ML IV BOLUS
INTRAVENOUS | Status: DC | PRN
  Administered 2016-01-12: 30 mg via INTRAVENOUS
  Administered 2016-01-12: 70 mg via INTRAVENOUS

## 2016-01-12 MED ORDER — GABAPENTIN 300 MG PO CAPS
ORAL_CAPSULE | ORAL | Status: AC
  Administered 2016-01-12: 300 mg via ORAL
  Filled 2016-01-12: qty 1

## 2016-01-12 MED ORDER — SODIUM CHLORIDE 0.9 % IV SOLN
1.0000 g | INTRAVENOUS | Status: AC
  Administered 2016-01-12: 1 g via INTRAVENOUS
  Filled 2016-01-12: qty 1

## 2016-01-12 MED ORDER — EPHEDRINE SULFATE 50 MG/ML IJ SOLN
INTRAMUSCULAR | Status: DC | PRN
Start: 2016-01-12 — End: 2016-01-12
  Administered 2016-01-12: 10 mg via INTRAVENOUS
  Administered 2016-01-12: 5 mg via INTRAVENOUS

## 2016-01-12 MED ORDER — FENTANYL CITRATE (PF) 100 MCG/2ML IJ SOLN: 25.0000 ug | INTRAMUSCULAR | Status: DC | PRN

## 2016-01-12 MED ORDER — PROPOFOL 500 MG/50ML IV EMUL
INTRAVENOUS | Status: DC | PRN
  Administered 2016-01-12: 120 ug/kg/min via INTRAVENOUS

## 2016-01-12 MED ORDER — ALVIMOPAN 12 MG PO CAPS
12.0000 mg | ORAL_CAPSULE | Freq: Once | ORAL | Status: AC
  Administered 2016-01-12: 12 mg via ORAL

## 2016-01-12 SURGICAL SUPPLY — 72 items
APPLIER CLIP ROT 10 11.4 M/L (STAPLE)
BLADE SURG 10 STRL SS SAFETY (BLADE) IMPLANT
BLADE SURG 11 STRL SS SAFETY (MISCELLANEOUS) IMPLANT
CANISTER SUCT 1200ML W/VALVE (MISCELLANEOUS) IMPLANT
CANNULA DILATOR 10 W/SLV (CANNULA) IMPLANT
CANNULA DILATOR 10MM W/SLV (CANNULA)
CATH TRAY 16F METER LATEX (MISCELLANEOUS) IMPLANT
CHLORAPREP W/TINT 26ML (MISCELLANEOUS) IMPLANT
CLEANER CAUTERY TIP 5X5 PAD (MISCELLANEOUS) IMPLANT
CLIP APPLIE ROT 10 11.4 M/L (STAPLE) IMPLANT
CLOSURE WOUND 1/2 X4 (GAUZE/BANDAGES/DRESSINGS)
COVER CLAMP SIL LG PBX B (MISCELLANEOUS) IMPLANT
DEVICE HAND ACCESS DEXTUS (MISCELLANEOUS) IMPLANT
DRAPE LAP W/FLUID (DRAPES) IMPLANT
DRAPE UNDER BUTTOCK W/FLU (DRAPES) IMPLANT
DRSG OPSITE POSTOP 4X10 (GAUZE/BANDAGES/DRESSINGS) IMPLANT
DRSG OPSITE POSTOP 4X8 (GAUZE/BANDAGES/DRESSINGS) IMPLANT
DRSG TEGADERM 2-3/8X2-3/4 SM (GAUZE/BANDAGES/DRESSINGS) IMPLANT
DRSG TEGADERM 4X4.75 (GAUZE/BANDAGES/DRESSINGS) IMPLANT
DRSG TELFA 3X8 NADH (GAUZE/BANDAGES/DRESSINGS) IMPLANT
ELECT BLADE 6.5 EXT (BLADE) IMPLANT
ELECT REM PT RETURN 9FT ADLT (ELECTROSURGICAL) ×3
ELECTRODE REM PT RTRN 9FT ADLT (ELECTROSURGICAL) ×1 IMPLANT
FILTER LAP SMOKE EVAC STRL (MISCELLANEOUS) IMPLANT
GAUZE SPONGE 4X4 12PLY STRL (GAUZE/BANDAGES/DRESSINGS) ×3 IMPLANT
GLOVE BIO SURGEON STRL SZ7 (GLOVE) IMPLANT
GLOVE BIO SURGEON STRL SZ7.5 (GLOVE) ×3 IMPLANT
GLOVE INDICATOR 8.0 STRL GRN (GLOVE) IMPLANT
GOWN STRL REUS W/ TWL LRG LVL3 (GOWN DISPOSABLE) IMPLANT
GOWN STRL REUS W/TWL LRG LVL3 (GOWN DISPOSABLE)
IRRIGATION STRYKERFLOW (MISCELLANEOUS) IMPLANT
IRRIGATOR STRYKERFLOW (MISCELLANEOUS)
IV LACTATED RINGERS 1000ML (IV SOLUTION) IMPLANT
KIT RM TURNOVER CYSTO AR (KITS) IMPLANT
LABEL OR SOLS (LABEL) IMPLANT
MARKER SPOT ENDO TATTOO 5ML (MISCELLANEOUS) ×1 IMPLANT
NDL INSUFF ACCESS 14 VERSASTEP (NEEDLE) IMPLANT
NS IRRIG 500ML POUR BTL (IV SOLUTION) IMPLANT
PACK COLON CLEAN CLOSURE (MISCELLANEOUS) IMPLANT
PACK LAP CHOLECYSTECTOMY (MISCELLANEOUS) IMPLANT
PAD CLEANER CAUTERY TIP 5X5 (MISCELLANEOUS)
PAD PREP 24X41 OB/GYN DISP (PERSONAL CARE ITEMS) IMPLANT
PENCIL ELECTRO HAND CTR (MISCELLANEOUS) IMPLANT
PROT DEXTUS HAND ACCESS (MISCELLANEOUS)
RETAINER VISCERA MED (MISCELLANEOUS) IMPLANT
RETRACTOR FIXED LENGTH SML (MISCELLANEOUS) IMPLANT
RETRACTOR WND ALEXIS-O 25 LRG (MISCELLANEOUS) IMPLANT
RTRCTR WOUND ALEXIS O 25CM LRG (MISCELLANEOUS)
SCISSORS METZENBAUM CVD 33 (INSTRUMENTS) IMPLANT
SEAL FOR SCOPE WARMER C3101 (MISCELLANEOUS) IMPLANT
SET YANKAUER POOLE SUCT (MISCELLANEOUS) IMPLANT
SHEARS HARMONIC ACE PLUS 36CM (ENDOMECHANICALS) IMPLANT
SPONGE LAP 18X18 5 PK (GAUZE/BANDAGES/DRESSINGS) IMPLANT
SPOT EX ENDOSCOPIC TATTOO (MISCELLANEOUS) ×2
STRIP CLOSURE SKIN 1/2X4 (GAUZE/BANDAGES/DRESSINGS) IMPLANT
SUT PROLENE 0 CT 1 30 (SUTURE) IMPLANT
SUT SILK 2 0 (SUTURE)
SUT SILK 2-0 30XBRD TIE 12 (SUTURE) IMPLANT
SUT SILK 3-0 (SUTURE) IMPLANT
SUT VIC AB 2-0 BRD 54 (SUTURE) IMPLANT
SUT VIC AB 2-0 CT1 27 (SUTURE)
SUT VIC AB 2-0 CT1 TAPERPNT 27 (SUTURE) IMPLANT
SUT VIC AB 3-0 54X BRD REEL (SUTURE) IMPLANT
SUT VIC AB 3-0 BRD 54 (SUTURE)
SUT VIC AB 3-0 SH 27 (SUTURE)
SUT VIC AB 3-0 SH 27X BRD (SUTURE) IMPLANT
SUT VIC AB 4-0 FS2 27 (SUTURE) IMPLANT
SYR BULB IRRIG 60ML STRL (SYRINGE) IMPLANT
TROCAR XCEL NON-BLD 11X100MML (ENDOMECHANICALS) IMPLANT
TROCAR XCEL UNIV SLVE 11M 100M (ENDOMECHANICALS) IMPLANT
TUBING INSUFFLATOR HEATED (MISCELLANEOUS) IMPLANT
WATER STERILE IRR 1000ML POUR (IV SOLUTION) IMPLANT

## 2016-01-12 NOTE — Anesthesia Preprocedure Evaluation (Signed)
Anesthesia Evaluation  Patient identified by MRN, date of birth, ID band Patient awake    Reviewed: Allergy & Precautions, NPO status , Patient's Chart, lab work & pertinent test results, reviewed documented beta blocker date and time   Airway Mallampati: II  TM Distance: >3 FB     Dental  (+) Chipped   Pulmonary           Cardiovascular      Neuro/Psych  Neuromuscular disease    GI/Hepatic GERD  Controlled,  Endo/Other    Renal/GU      Musculoskeletal   Abdominal   Peds  Hematology  (+) anemia ,   Anesthesia Other Findings   Reproductive/Obstetrics                             Anesthesia Physical Anesthesia Plan  ASA: III  Anesthesia Plan: General   Post-op Pain Management:    Induction: Intravenous  Airway Management Planned: Oral ETT  Additional Equipment:   Intra-op Plan:   Post-operative Plan:   Informed Consent: I have reviewed the patients History and Physical, chart, labs and discussed the procedure including the risks, benefits and alternatives for the proposed anesthesia with the patient or authorized representative who has indicated his/her understanding and acceptance.     Plan Discussed with: CRNA  Anesthesia Plan Comments:         Anesthesia Quick Evaluation

## 2016-01-12 NOTE — H&P (Signed)
No change in clinical history or exam.  Reviewed plans for preop colonoscopy and possible polypectomy with delay of surgical intervention to another date if polypectomy possible.

## 2016-01-12 NOTE — Transfer of Care (Signed)
Immediate Anesthesia Transfer of Care Note  Patient: Tonya Randolph  Procedure(s) Performed: Procedure(s): COLONOSCOPY IN O.R with removal of rectal polyp (N/A)  Patient Location: PACU  Anesthesia Type:General  Level of Consciousness: awake and alert   Airway & Oxygen Therapy: Patient Spontanous Breathing  Post-op Assessment: Report given to RN and Post -op Vital signs reviewed and stable  Post vital signs: Reviewed  Last Vitals:  Vitals:   01/12/16 0628 01/12/16 0821  BP: 131/69 (!) 94/52  Pulse: 63 88  Resp: 18 16  Temp: 36.8 C 36.7 C    Last Pain:  Vitals:   01/12/16 0628  TempSrc: Oral      Patients Stated Pain Goal: 2 ([id number])  Complications: No apparent anesthesia complications

## 2016-01-12 NOTE — Op Note (Signed)
[facility] Gastroenterology Patient Name: Tonya Randolph Procedure Date: 01/12/2016 7:22 AM MRN: [id number] Account #: [id number] Date of Birth: 30-Jan-1956 Admit Type: Outpatient Age: 60 Room: OR 8 Gender: Female Note Status: Finalized Procedure:            Colonoscopy Indications:          Therapeutic procedure for known colon polyp Providers:            Robert Bellow, MD Medicines:            Monitored Anesthesia Care Complications:        No immediate complications. Procedure:            Pre-Anesthesia Assessment:                       - Prior to the procedure, a History and Physical was                        performed, and patient medications, allergies and                        sensitivities were reviewed. The patient's tolerance of                        previous anesthesia was reviewed.                       - The risks and benefits of the procedure and the                        sedation options and risks were discussed with the                        patient. All questions were answered and informed                        consent was obtained.                       After obtaining informed consent, the colonoscope was                        passed under direct vision. Throughout the procedure,                        the patient's blood pressure, pulse, and oxygen                        saturations were monitored continuously. The                        Colonoscope was introduced through the anus and                        advanced to the the sigmoid colon to examine a mass.                        This was the intended extent. The colonoscopy was                        performed without difficulty. The patient tolerated the  procedure well. The quality of the bowel preparation                        was excellent. Findings:      A 35 mm polyp was found in the recto-sigmoid colon. The polyp was       semi-pedunculated.  The polyp was removed with a hot snare. Multiple       resections were completed. The base was sent as a separte speciment to       pathology. Resection and retrieval were complete. To prevent bleeding       post-intervention, two hemostatic clips were successfully placed (MR       conditional). There was no bleeding at the end of the procedure. Area       was successfully injected with 4 mL [place] ink for tattooing. Impression:           - One 35 mm polyp at the recto-sigmoid colon, removed                        with a hot snare. Resected and retrieved. Clips (MR                        conditional) were placed. Injected. Recommendation:       - Telephone endoscopist for pathology results in 1 week. Procedure Code(s):    --- Professional ---                       [phone], 52, Colonoscopy, flexible; with removal of                        tumor(s), polyp(s), or other lesion(s) by snare                        technique                       [id number], 52, Colonoscopy, flexible; with directed                        submucosal injection(s), any substance Diagnosis Code(s):    --- Professional ---                       K63.5, Polyp of colon                       D12.7, Benign neoplasm of rectosigmoid junction CPT copyright 2016 American Medical Association. All rights reserved. The codes documented in this report are preliminary and upon coder review may  be revised to meet current compliance requirements. Robert Bellow, MD 01/12/2016 8:18:45 AM This report has been signed electronically. Number of Addenda: 0 Note Initiated On: 01/12/2016 7:22 AM      [facility]

## 2016-01-12 NOTE — Op Note (Signed)
Preoperative diagnosis: Rectal polyp.  Postoperative diagnosis: Same.  Operative procedure: Colonoscopic polypectomy.  Operating surgeon: Hervey Ard, M.D.  Anesthesia: Monitored anesthesia care.  Estimated blood loss: Minimal.  Clinical note: This 60 year old woman had undergone a screening colonoscopy with identification of a large polyp at the 12-15 cm mark in the rectum. She was referred for surgical resection. On review of the images and rigid sigmoidoscopy was felt that a colonoscopic polypectomy might be possible. Plans for repeat colonoscopy colonoscopy with polypectomy was reviewed with the patient and she was amenable to proceed, recognizing if the polyp was not able to be removed a formal low anterior resection would be completed. She was prepared with a formal bowel prep, had oral antibiotics and received Invanz preoperatively anticipating a formal colon resection.  Operative note: The patient was placed in the left lateral decubitus position on the cart. The colonoscope was advanced to the lesion. The polyp was removed piecemeal with the cautery snare. The base was removed as a separate piece and sent separately for pathologic review. The defect was closed with 2 titanium clips. No bleeding was noted. The area was inked in the event that surgical resection is required in the future.  The patient was taken recurrent stable condition.

## 2016-01-12 NOTE — Discharge Instructions (Signed)

## 2016-01-12 NOTE — Anesthesia Postprocedure Evaluation (Signed)
Anesthesia Post Note  Patient: Tonya Randolph  Procedure(s) Performed: Procedure(s) (LRB): COLONOSCOPY IN O.R with removal of rectal polyp (N/A)  Patient location during evaluation: PACU Anesthesia Type: General Level of consciousness: awake and alert Pain management: pain level controlled Vital Signs Assessment: post-procedure vital signs reviewed and stable Respiratory status: spontaneous breathing, nonlabored ventilation, respiratory function stable and patient connected to nasal cannula oxygen Cardiovascular status: blood pressure returned to baseline and stable Postop Assessment: no signs of nausea or vomiting Anesthetic complications: no    Last Vitals:  Vitals:   01/12/16 0902 01/12/16 0937  BP: 116/60 122/84  Pulse: 61 65  Resp: 18 18  Temp: 36.5 C     Last Pain:  Vitals:   01/12/16 0937  TempSrc:   PainSc: [place]

## 2016-01-15 ENCOUNTER — Telehealth: Payer: Self-pay | Admitting: *Deleted

## 2016-01-15 NOTE — Telephone Encounter (Signed)
You did patients surgery on Friday 01/12/16, on her discharge papers it says to call the office on Monday for a post op. How many days did you want to see her back in the office?

## 2016-01-17 LAB — SURGICAL PATHOLOGY

## 2016-01-23 NOTE — Telephone Encounter (Signed)
I had asked that she be put in recalls for a 6 month follow-up with office sigmoidoscopy. I would rather have her come to the office for a visit and we'll plan for a flexible sigmoidoscopy at the hospital after that time.

## 2016-01-24 NOTE — Telephone Encounter (Signed)
Called patient and left her a message to call the office back regarding her question. I already placed her in recalls.

## 2016-04-29 IMAGING — US US RENAL
1 series · 14 of 25 positions shown · non-contrast
Comparison: CT [DATE] .

CLINICAL DATA: Recurrent UTI.

EXAM:
RENAL / URINARY TRACT ULTRASOUND COMPLETE

[Series 1: us renal · 0.22mm/px · 14 of 37 slices shown]
[im 1/37]
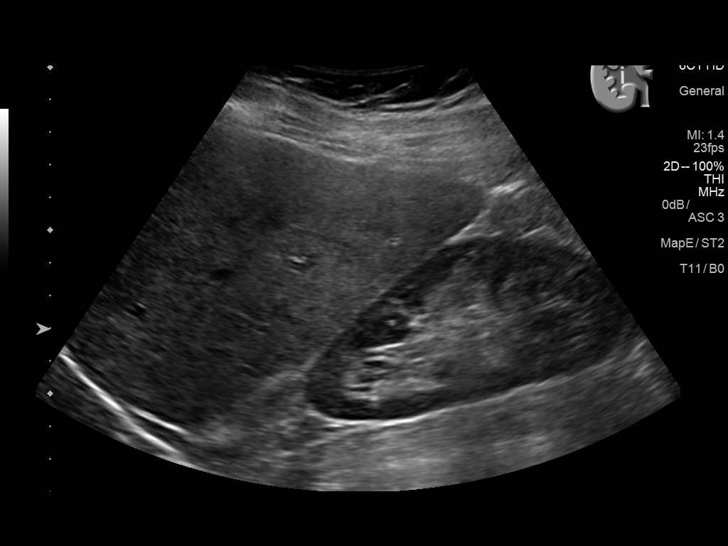
[im 4/37]
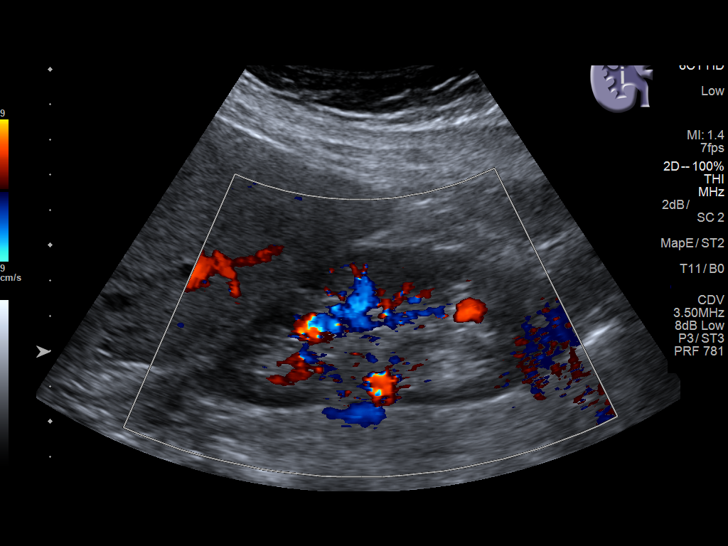
[im 7/37]
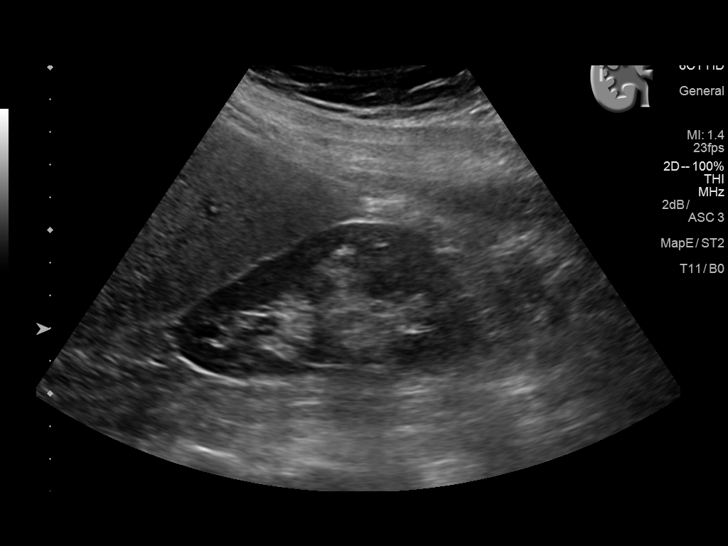
[im 10/37]
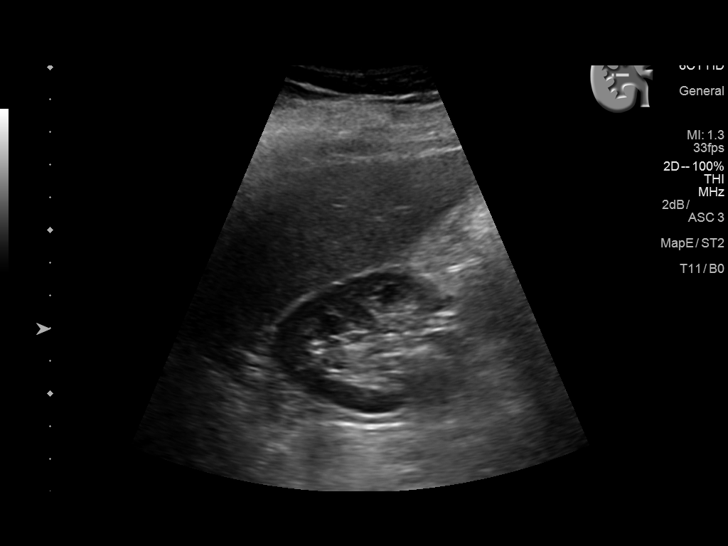
[im 13/37]
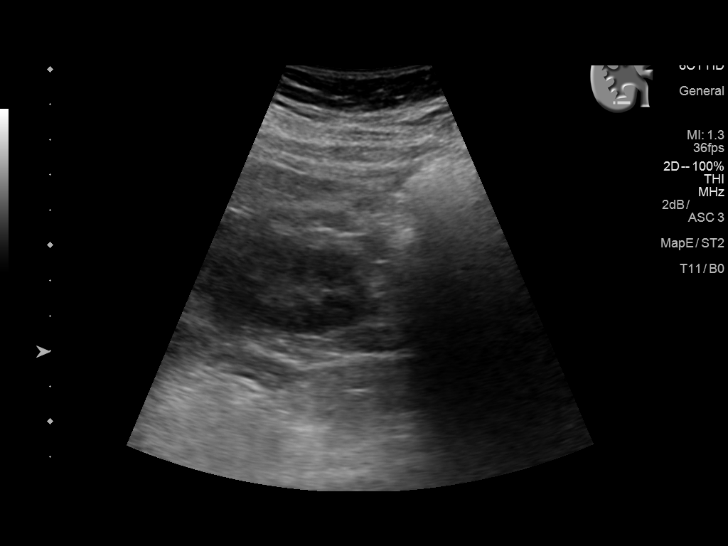
[im 14/37]
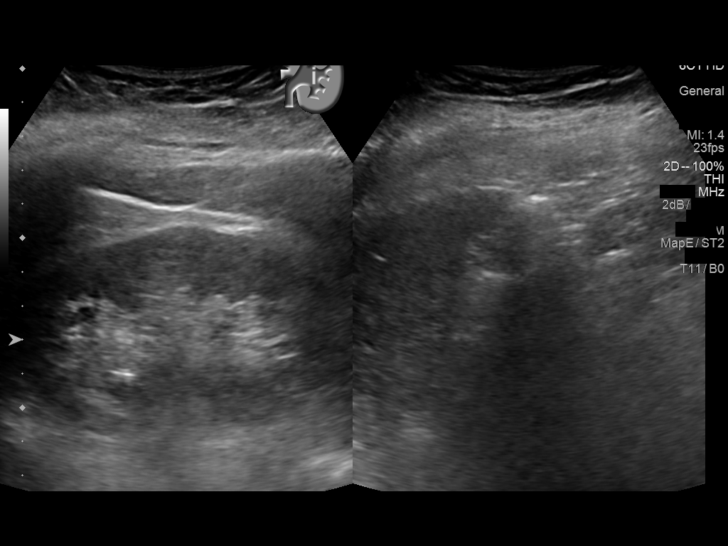
[im 17/37]
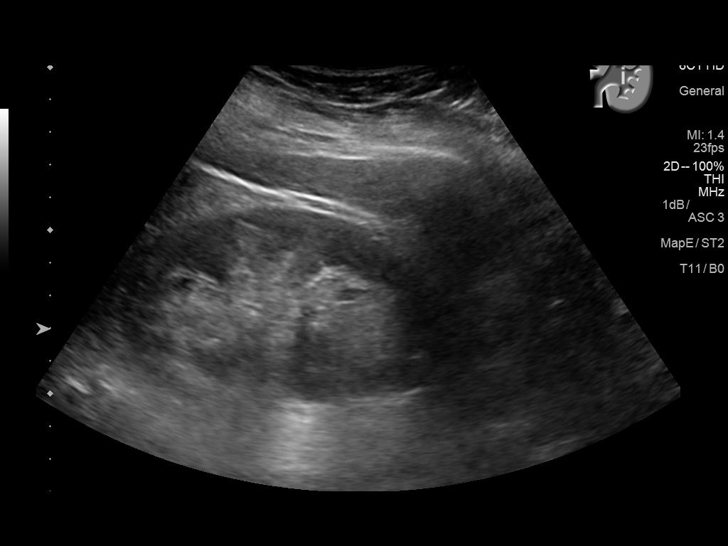
[im 20/37]
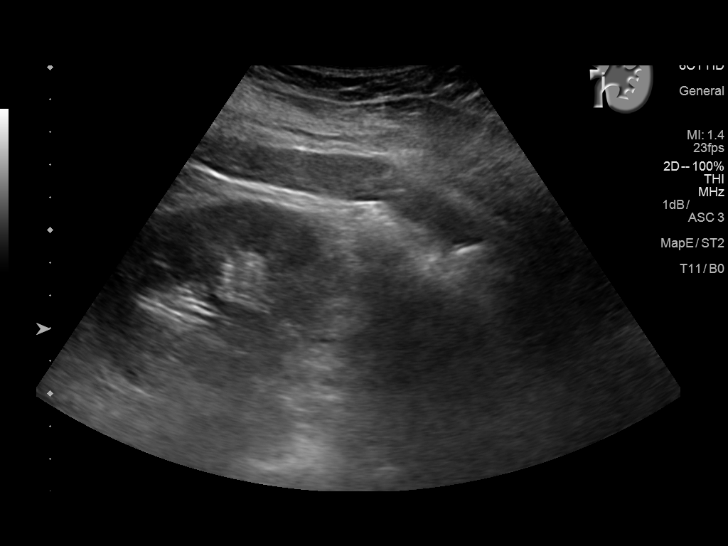
[im 23/37]
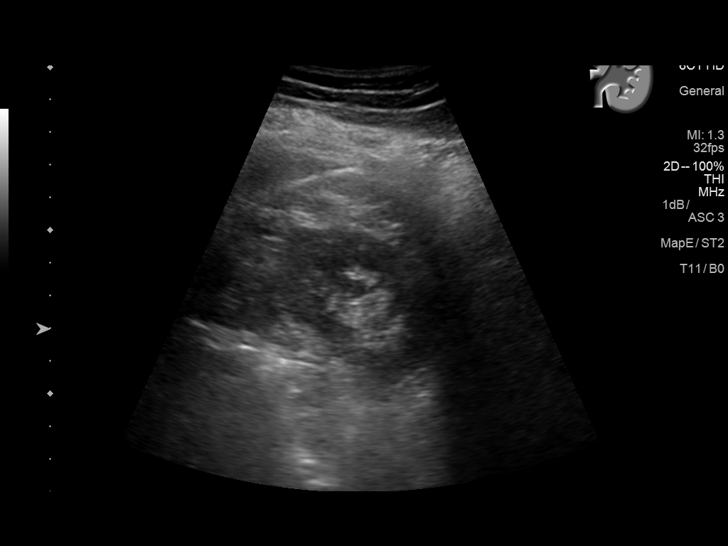
[im 25/37]
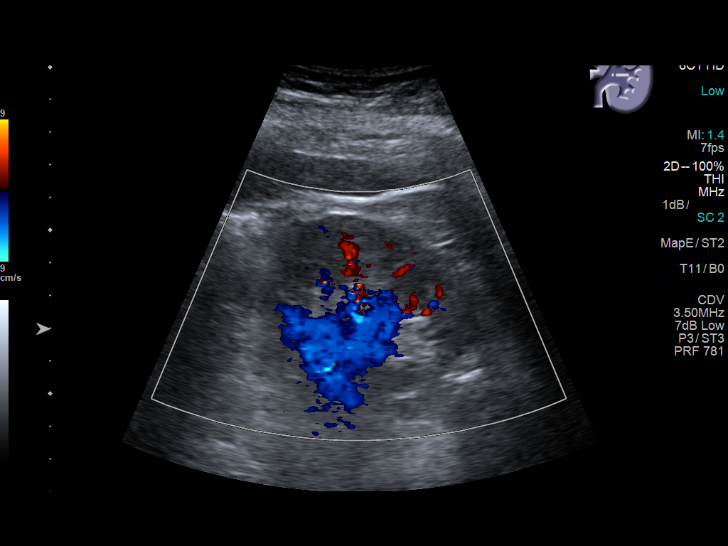
[im 28/37]
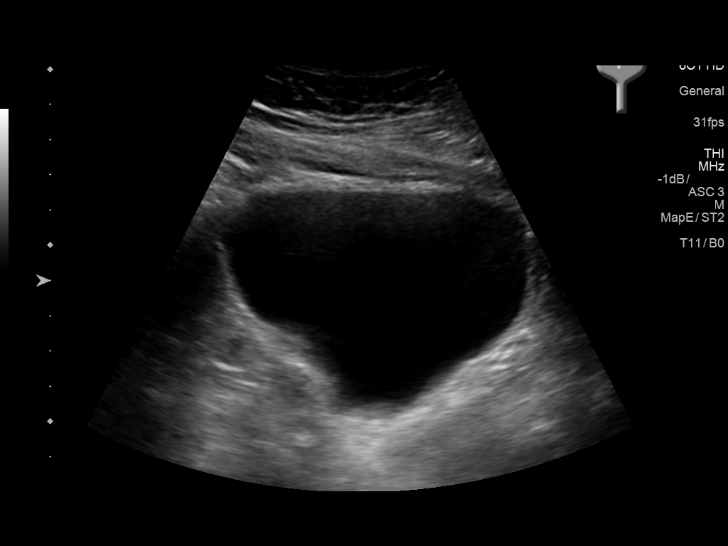
[im 31/37]
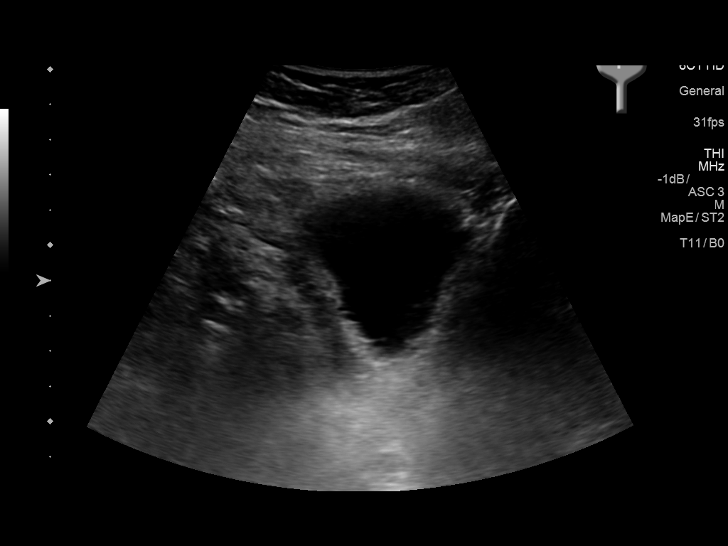
[im 34/37]
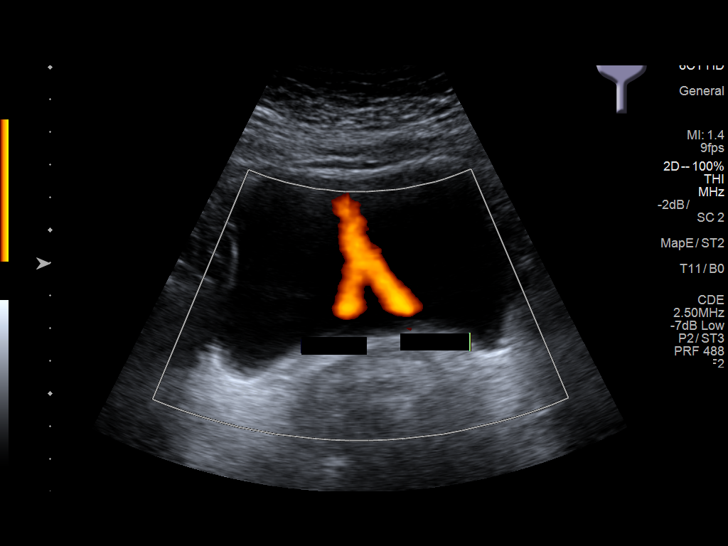
[im 37/37]
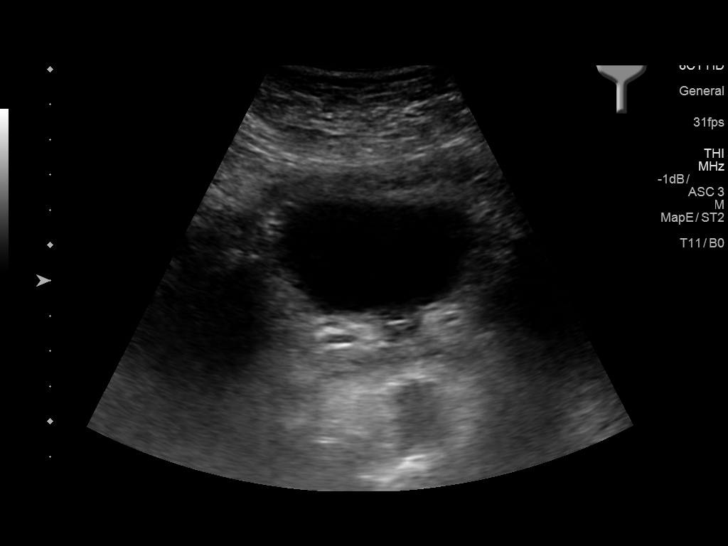

[14 of 25 positions shown; findings below may reference images not displayed]

FINDINGS: Right Kidney:

Length: 10.3 cm. Echogenicity within normal limits. No mass or
hydronephrosis visualized.

Left Kidney:

Length: 10.0 cm. Echogenicity within normal limits. No mass or
hydronephrosis visualized.

Bladder:

Appears normal for degree of bladder distention.
IMPRESSION: No acute or focal abnormality identified.

## 2016-07-16 ENCOUNTER — Encounter: Payer: Self-pay | Admitting: General Surgery

## 2016-07-16 ENCOUNTER — Ambulatory Visit (INDEPENDENT_AMBULATORY_CARE_PROVIDER_SITE_OTHER): Payer: BC Managed Care – PPO | Admitting: General Surgery

## 2016-07-16 VITALS — BP 138/78 | HR 82 | Resp 14 | Ht 65.0 in | Wt 175.0 lb

## 2016-07-16 DIAGNOSIS — K621 Rectal polyp: Secondary | ICD-10-CM | POA: Diagnosis not present

## 2016-07-16 NOTE — Patient Instructions (Signed)
The patient is aware to call back for any questions or concerns.  

## 2016-07-16 NOTE — Progress Notes (Signed)
Patient ID: Tonya Randolph, female   DOB: 1955/12/06, 61 y.o.   MRN: [id number]  Chief Complaint  Patient presents with  . Procedure    HPI Tonya Randolph is a 61 y.o. female here today for a sigmoidoscopy.  HPI  Past Medical History:  Diagnosis Date  . Anemia 2014  . Brachial plexus injury   . 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 Colon polyp 2017  . GERD (gastroesophageal reflux disease)     Past Surgical History:  Procedure Laterality Date  . basal cell removal  2017   left arm  . CARPAL TUNNEL RELEASE  1985  . COLONOSCOPY N/A 01/12/2016   Procedure: COLONOSCOPY IN O.R with removal of rectal polyp;  Surgeon: Robert Bellow, MD;  Location: [facility] ORS;  Service: General;  Laterality: N/A;  . COLONOSCOPY WITH PROPOFOL N/A 12/04/2015   Procedure: COLONOSCOPY WITH PROPOFOL;  Surgeon: Lollie Sails, MD;  Location: [facility] ENDOSCOPY;  Service: Endoscopy;  Laterality: N/A;  . TONSILLECTOMY  1973  . TUBAL LIGATION  1986    Family History  Problem Relation Age of Onset  . Cancer Mother     ovarian    Social History Social History  Substance Use Topics  . Smoking status: Never Smoker  . Smokeless tobacco: Never Used  . Alcohol use Yes     Comment: rare    No Known Allergies  Current Outpatient Prescriptions  Medication Sig Dispense Refill  . cetirizine (ZYRTEC) 10 MG tablet Take 10 mg by mouth every morning.      No current facility-administered medications for this visit.     Review of Systems Review of Systems  Constitutional: Negative.   Respiratory: Negative.   Cardiovascular: Negative.     Blood pressure 138/78, pulse 82, resp. rate 14, height 5\' 5"  (1.651 m), weight 175 lb (79.4 kg).  Physical Exam Physical Exam  Constitutional: She appears well-developed and well-nourished.  Neck: Normal range of motion. Neck supple.  Cardiovascular: Normal rate and regular rhythm.   Pulmonary/Chest: Effort normal and breath sounds normal.   Abdominal: Soft.  Rigid sigmoidoscopy was completed to 20 cm. Area of previous tattoo at 12 cm visualized. Smooth mucosa without evidence of recurrent polyp formation. No ulceration or significant scarring.    Data Reviewed 01/08/2016 colonoscopy/polypectomy: A. 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  - CAUTERIZED ADENOMATOUS CHANGE EXTENDS TO MUCOSAL EDGE.  - NEGATIVE FOR INVASION.   B. POLYP BASE:  - TUBULOVILLOUS ADENOMA.  - CAUTERIZED ADENOMATOUS CHANGE EXTENDS TO MUCOSAL EDGE.  - NEGATIVE FOR INVASION.    Assessment    No evidence of recurrent tubulovillous adenoma in the rectum.   Plan    Plans for repeat rigid sigmoidoscopy in 6 months were reviewed.    Follow up in 6 months.   This information has been scribed by Karie Fetch RN, BSN,BC. Marland Kitchen   Robert Bellow 07/16/2016, 7:53 PM

## 2016-07-25 NOTE — Progress Notes (Signed)
Thank you :)

## 2016-10-11 ENCOUNTER — Ambulatory Visit: Payer: Self-pay | Admitting: Urology

## 2016-11-14 ENCOUNTER — Other Ambulatory Visit: Payer: Self-pay | Admitting: *Deleted

## 2016-11-14 DIAGNOSIS — N390 Urinary tract infection, site not specified: Secondary | ICD-10-CM

## 2016-11-14 NOTE — Progress Notes (Signed)
11/15/2016 10:15 AM   Tonya Randolph [birth year] [id number]  Referring provider: [name], MD [address], [place] [address]  Chief Complaint  Patient presents with  . New Patient (Initial Visit)    recurrent uti referred by Dr. Ellison Hughs    HPI: Patient is a 61 -year-old Caucasian female who is referred to [place] by, Dr. Thereasa Distance, for recurrent urinary tract infections.  Patient states that she has had seven urinary tract infections over the last year.  Reviewing her records,  she has had one positive UTI for E. Coli.  She has had several UTI's over the last two years and an episode of AMH in 09/2015 with a negative culture.      Her symptoms with a urinary tract infection consist of spasm at the end of urination.  It can last up to the hour.  Voiding does not relieve the pain.     She denies dysuria, gross hematuria, suprapubic pain, back pain, abdominal pain or flank pain.  She has not had any recent fevers, chills, nausea or vomiting.  She does not have a history of nephrolithiasis, GU surgery or GU trauma.   She is sexually active.  She has noted a correlation with her urinary tract infections and sexual intercourse.   She does not engage in anal sex.   She is voiding before and after sex.   She is showering and her husband is showering prior to intercourse.    She is  postmenopausal.   She admits to constipation and/or diarrhea.   She does  engage in good perineal hygiene. She does not take tub baths.  She is dribbling when she wipes.    She is not having pain with bladder filling, she is having pain with urination.    She has not had any recent imaging studies.    She is drinking a limited amount of water daily.  She drinks small amounts of soda's and coffee.  Her PVR is 0 mL.    PMH: Past Medical History:  Diagnosis Date  . Anemia 2014  . Brachial plexus injury   . 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 Colon polyp 2017  .  GERD (gastroesophageal reflux disease)   . Heart murmur     Surgical History: Past Surgical History:  Procedure Laterality Date  . basal cell removal  2017   left arm  . CARPAL TUNNEL RELEASE  1985  . COLONOSCOPY N/A 01/12/2016   Procedure: COLONOSCOPY IN O.R with removal of rectal polyp;  Surgeon: Robert Bellow, MD;  Location: [facility] ORS;  Service: General;  Laterality: N/A;  . COLONOSCOPY WITH PROPOFOL N/A 12/04/2015   Procedure: COLONOSCOPY WITH PROPOFOL;  Surgeon: Lollie Sails, MD;  Location: [facility] ENDOSCOPY;  Service: Endoscopy;  Laterality: N/A;  . TONSILLECTOMY  1973  . TUBAL LIGATION  1986    Home Medications:  Allergies as of 11/15/2016   No Known Allergies     Medication List       Accurate as of 11/15/16 10:15 AM. Always use your most recent med list.          cetirizine 10 MG tablet Commonly known as:  ZYRTEC Take 10 mg by mouth every morning.   conjugated estrogens vaginal cream Commonly known as:  PREMARIN Place 1 Applicatorful vaginally daily. Apply 0.26m (pea-sized amount)  just inside the vaginal introitus with a finger-tip on Monday, Wednesday and Friday nights.  omeprazole 20 MG capsule Commonly known as:  PRILOSEC Take 20 mg by mouth daily.       Allergies: No Known Allergies  Family History: Family History  Problem Relation Age of Onset  . Cancer Mother        ovarian  . Kidney cancer Neg Hx   . Bladder Cancer Neg Hx     Social History:  reports that she has never smoked. She has never used smokeless tobacco. She reports that she drinks alcohol. She reports that she does not use drugs.  ROS: UROLOGY Frequent Urination?: No Hard to postpone urination?: No Burning/pain with urination?: No Get up at night to urinate?: No Leakage of urine?: No Urine stream starts and stops?: No Trouble starting stream?: No Do you have to strain to urinate?: No Blood in urine?: No Urinary tract infection?: Yes Sexually transmitted disease?:  No Injury to kidneys or bladder?: No Painful intercourse?: Yes Weak stream?: No Currently pregnant?: No Vaginal bleeding?: No Last menstrual period?: n  Gastrointestinal Nausea?: No Vomiting?: No Indigestion/heartburn?: No Diarrhea?: No Constipation?: No  Constitutional Fever: No Night sweats?: No Weight loss?: No Fatigue?: No  Skin Skin rash/lesions?: No Itching?: No  Eyes Blurred vision?: No Double vision?: No  Ears/Nose/Throat Sore throat?: No Sinus problems?: No  Hematologic/Lymphatic Swollen glands?: No Easy bruising?: No  Cardiovascular Leg swelling?: No Chest pain?: No  Respiratory Cough?: No Shortness of breath?: No  Endocrine Excessive thirst?: No  Musculoskeletal Back pain?: No Joint pain?: No  Neurological Headaches?: No Dizziness?: No  Psychologic Depression?: No Anxiety?: No  Physical Exam: BP 138/79   Pulse 73   Ht _0  (1.651 m)   Wt 175 lb (79.4 kg)   LMP  (LMP Unknown)   BMI 29.12 kg/m   Constitutional: Well nourished. Alert and oriented, No acute distress. HEENT: [place] AT, moist mucus membranes. Trachea midline, no masses. Cardiovascular: No clubbing, cyanosis, or edema. Respiratory: Normal respiratory effort, no increased work of breathing. GI: Abdomen is soft, non tender, non distended, no abdominal masses. Liver and spleen not palpable.  No hernias appreciated.  Stool sample for occult testing is not indicated.   GU: No CVA tenderness.  No bladder fullness or masses.  Atrophic external genitalia, normal pubic hair distribution, no lesions.  Normal urethral meatus, no lesions, no prolapse, no discharge.   No urethral masses, tenderness and/or tenderness. No bladder fullness, tenderness or masses. Pale vagina mucosa, poor estrogen effect, no discharge, no lesions, good pelvic support, Grade I cystocele.  Leaking seen on Valsalva.  No rectocele noted.  No cervical motion tenderness.  Uterus is freely mobile and non-fixed.  No  adnexal/parametria masses or tenderness noted.  Anus and perineum are without rashes or lesions.    Skin: No rashes, bruises or suspicious lesions. Lymph: No cervical or inguinal adenopathy. Neurologic: Grossly intact, no focal deficits, moving all 4 extremities. Psychiatric: Normal mood and affect.  Laboratory Data:  Creatinine 0.6 in 11/2015  I have reviewed the labs.   Pertinent Imaging: Results for RAINBOW, SALMAN (MRN [id number]) as of 11/15/2016 10:00  Ref. Range 11/15/2016 09:12  Scan Result Unknown 0   I have independently reviewed the films.    Assessment & Plan:    1. Bladder spasms  - criteria for recurrent UTI has not been met with 2 or more infections in 6 months or 3 or greater infections in one year  - Patient is instructed to increase their water intake until the urine is pale yellow  or clear (10 to 12 cups daily)   - probiotics (yogurt, oral pills or vaginal suppositories), take cranberry pills or drink the juice and Vitamin C 1,000 mg daily to acidify the urine should be added to their daily regimen   - avoid soaking in tubs and wipe front to back after urinating  - benefit from core strengthening exercises has been seen.  We can refer her to PT if they desire - given sheet on Kegel's  - advised them to have CATH UA's for urinalysis and culture to prevent skin contamination of the specimen  - reviewed symptoms of UTI and advised not to have urine checked or be treated for UTI if not experiencing symptoms  - discussed antibiotic stewardship with the patient    2. History of hematuria  - I explained to the patient that there are a number of causes that can be associated with blood in the urine, such as stones, UTI's, damage to the urinary tract and/or cancer.  - As this occurred one year ago, I will start with a renal ultrasound - if [facility] is seen again will pursue CTU and cystoscopy   3. Vaginal atrophy  - I explained to the patient that when women go through  menopause and her estrogen levels are severely diminished, the normal vaginal flora will change.  This is due to an increase of the vaginal canal's pH. Because of this, the vaginal canal may be colonized by bacteria from the rectum instead of the protective lactobacillus.  This accompanied by the loss of the mucus barrier with vaginal atrophy is a cause of recurrent urinary tract infections.  - In some studies, the use of vaginal estrogen cream has been demonstrated to reduce  recurrent urinary tract infections to one a year.   - Patient was given a sample of vaginal estrogen cream (Premarin) and instructed to apply 0.45m (pea-sized amount)  just inside the vaginal introitus with a finger-tip on Monday, Wednesday and Friday nights.  I explained to the patient that vaginally administered estrogen, which causes only a slight increase in the blood estrogen levels, have fewer contraindications and adverse systemic effects that oral HT.  - She will follow up in one month for an exam.                                              Return in about 1 month (around 12/16/2016) for PVR and OAB questionnaire.  These notes generated with voice recognition software. I apologize for typographical errors.  SZara Council [facility] [address] [place] [phone]

## 2016-11-15 ENCOUNTER — Telehealth: Payer: Self-pay | Admitting: Urology

## 2016-11-15 ENCOUNTER — Ambulatory Visit (INDEPENDENT_AMBULATORY_CARE_PROVIDER_SITE_OTHER): Payer: BC Managed Care – PPO | Admitting: Urology

## 2016-11-15 ENCOUNTER — Encounter: Payer: Self-pay | Admitting: Urology

## 2016-11-15 VITALS — BP 138/79 | HR 73 | Ht 65.0 in | Wt 175.0 lb

## 2016-11-15 DIAGNOSIS — N390 Urinary tract infection, site not specified: Secondary | ICD-10-CM

## 2016-11-15 DIAGNOSIS — N3289 Other specified disorders of bladder: Secondary | ICD-10-CM | POA: Diagnosis not present

## 2016-11-15 DIAGNOSIS — N952 Postmenopausal atrophic vaginitis: Secondary | ICD-10-CM | POA: Diagnosis not present

## 2016-11-15 DIAGNOSIS — Z87448 Personal history of other diseases of urinary system: Secondary | ICD-10-CM

## 2016-11-15 LAB — BLADDER SCAN AMB NON-IMAGING: SCAN RESULT: 0

## 2016-11-15 MED ORDER — ESTROGENS, CONJUGATED 0.625 MG/GM VA CREA: 1.0000 | TOPICAL_CREAM | Freq: Every day | VAGINAL | 12 refills | Status: DC

## 2016-11-15 NOTE — Telephone Encounter (Signed)
Would you send my note to Dr. Ellison Hughs?

## 2016-11-18 NOTE — Telephone Encounter (Signed)
Done ° ° °Michelle °

## 2016-12-10 ENCOUNTER — Ambulatory Visit: Payer: BC Managed Care – PPO

## 2016-12-10 ENCOUNTER — Ambulatory Visit
Admission: RE | Admit: 2016-12-10 | Discharge: 2016-12-10 | Disposition: A | Discharge status: Home / Self Care | Payer: BC Managed Care – PPO | Source: Ambulatory Visit | Attending: Urology | Admitting: Urology

## 2016-12-10 DIAGNOSIS — N3289 Other specified disorders of bladder: Secondary | ICD-10-CM | POA: Insufficient documentation

## 2016-12-18 NOTE — Progress Notes (Signed)
12/20/2016 8:58 AM   Tonya Randolph [birth year] [id number]  Referring provider: [name], MD [address], [place] [address]  Chief Complaint  Patient presents with  . Follow-up    1 month Bladder spasms  . Hematuria     month follow up    HPI: 61 yo WF with a history of bladder spasms, history of hematuria and vaginal atrophy who presents today for a one month follow up.  Background history Patient is a 86 -year-old Caucasian female who is referred to [place] by, Dr. Thereasa Distance, for recurrent urinary tract infections.  Patient states that she has had seven urinary tract infections over the last year.  Reviewing her records,  she has had one positive UTI for E. Coli.  She has had several UTI's over the last two years and an episode of AMH in 09/2015 with a negative culture.  Her symptoms with a urinary tract infection consist of spasm at the end of urination.  It can last up to the hour.  Voiding does not relieve the pain.   She denies dysuria, gross hematuria, suprapubic pain, back pain, abdominal pain or flank pain.  She has not had any recent fevers, chills, nausea or vomiting.  She does not have a history of nephrolithiasis, GU surgery or GU trauma.   She is sexually active.  She has noted a correlation with her urinary tract infections and sexual intercourse.   She does not engage in anal sex.   She is voiding before and after sex.   She is showering and her husband is showering prior to intercourse.  She is  postmenopausal.   She admits to constipation and/or diarrhea.  She does  engage in good perineal hygiene. She does not take tub baths.  She is dribbling when she wipes.  She is not having pain with bladder filling, she is having pain with urination.  She has not had any recent imaging studies.  She is drinking a limited amount of water daily.  She drinks small amounts of soda's and coffee.  Her PVR is 0 mL.    A RUS performed on 12/10/2016 was normal.     She has not had any bladder spasms since her visit with [place] one month ago.  She is applying the vaginal estrogen cream three nights weekly.    Today, the patient has been experiencing urgency x 0-3, frequency x 4-7, not restricting fluids to avoid visits to the restroom, not engaging in toilet mapping, incontinence x 0-3 and nocturia x 0-3.   Her PVR is 0.  Her UA today is unremarkable.      PMH: Past Medical History:  Diagnosis Date  . Anemia 2014  . Brachial plexus injury   . 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 Colon polyp 2017  . GERD (gastroesophageal reflux disease)   . Heart murmur     Surgical History: Past Surgical History:  Procedure Laterality Date  . basal cell removal  2017   left arm  . CARPAL TUNNEL RELEASE  1985  . COLON SURGERY  2017   tumor removed from colon  . COLONOSCOPY N/A 01/12/2016   Procedure: COLONOSCOPY IN O.R with removal of rectal polyp;  Surgeon: Robert Bellow, MD;  Location: [facility] ORS;  Service: General;  Laterality: N/A;  . COLONOSCOPY WITH PROPOFOL N/A 12/04/2015   Procedure: COLONOSCOPY WITH PROPOFOL;  Surgeon: Lollie Sails, MD;  Location[facility] ENDOSCOPY;  Service: Endoscopy;  Laterality: N/A;  . TONSILLECTOMY  1973  . TUBAL LIGATION  1986    Home Medications:  Allergies as of 12/20/2016   No Known Allergies     Medication List       Accurate as of 12/20/16  8:58 AM. Always use your most recent med list.          cetirizine 10 MG tablet Commonly known as:  ZYRTEC Take 10 mg by mouth every morning.   conjugated estrogens vaginal cream Commonly known as:  PREMARIN Place 1 Applicatorful vaginally daily. Apply 0.5mg  (pea-sized amount)  just inside the vaginal introitus with a finger-tip on Monday, Wednesday and Friday nights.   omeprazole 20 MG capsule Commonly known as:  PRILOSEC Take 20 mg by mouth daily.            Discharge Care Instructions        Start     Ordered   12/20/16 0000  BLADDER SCAN AMB  NON-IMAGING     12/20/16 0835      Allergies: No Known Allergies  Family History: Family History  Problem Relation Age of Onset  . Cancer Mother        ovarian  . Kidney cancer Neg Hx   . Bladder Cancer Neg Hx     Social History:  reports that she has never smoked. She has never used smokeless tobacco. She reports that she drinks alcohol. She reports that she does not use drugs.  ROS: UROLOGY Frequent Urination?: No Hard to postpone urination?: No Burning/pain with urination?: No Get up at night to urinate?: No Leakage of urine?: No Urine stream starts and stops?: No Trouble starting stream?: No Do you have to strain to urinate?: No Blood in urine?: No Urinary tract infection?: No Sexually transmitted disease?: No Injury to kidneys or bladder?: No Painful intercourse?: No Weak stream?: No Currently pregnant?: No Vaginal bleeding?: No Last menstrual period?: n  Gastrointestinal Nausea?: No Vomiting?: No Indigestion/heartburn?: No Diarrhea?: No Constipation?: No  Constitutional Fever: No Night sweats?: No Weight loss?: No Fatigue?: No  Skin Skin rash/lesions?: No Itching?: No  Eyes Blurred vision?: No Double vision?: No  Ears/Nose/Throat Sore throat?: No Sinus problems?: No  Hematologic/Lymphatic Swollen glands?: No Easy bruising?: No  Cardiovascular Leg swelling?: No Chest pain?: No  Respiratory Cough?: No Shortness of breath?: No  Endocrine Excessive thirst?: No  Musculoskeletal Back pain?: No Joint pain?: No  Neurological Headaches?: No Dizziness?: No  Psychologic Depression?: No Anxiety?: No  Physical Exam: BP 135/67   Pulse 64   Ht 5\' 5"  (1.651 m)   Wt 174 lb (78.9 kg)   LMP  (LMP Unknown)   BMI 28.96 kg/m   Constitutional: Well nourished. Alert and oriented, No acute distress. HEENT: [place] AT, moist mucus membranes. Trachea midline, no masses. Cardiovascular: No clubbing, cyanosis, or edema. Respiratory: Normal  respiratory effort, no increased work of breathing. Skin: No rashes, bruises or suspicious lesions. Lymph: No cervical or inguinal adenopathy. Neurologic: Grossly intact, no focal deficits, moving all 4 extremities. Psychiatric: Normal mood and affect.  Laboratory Data:  Creatinine 0.6 in 11/2015  Urinalysis Unremarkable.  See EPIC.  I have reviewed the labs.   Pertinent Imaging: CLINICAL DATA:  Recurrent UTI.  EXAM: RENAL / URINARY TRACT ULTRASOUND COMPLETE  COMPARISON:  CT 01/09/2015 .  FINDINGS: Right Kidney:  Length: 10.3 cm. Echogenicity within normal limits. No mass or hydronephrosis visualized.  Left Kidney:  Length: 10.0 cm. Echogenicity within normal limits. No  mass or hydronephrosis visualized.  Bladder:  Appears normal for degree of bladder distention.  IMPRESSION: No acute or focal abnormality identified.   Electronically Signed   By: Marcello Moores  Register   On: 12/10/2016 10:31  I have independently reviewed the films.    Assessment & Plan:    1. Bladder spasms  - encouraged water intake until the urine is pale yellow or clear (10 to 12 cups daily)   - reviewed prevention strategies for UTI's  - advised them to have CATH UA's for urinalysis and culture to prevent skin contamination of the specimen  - reviewed symptoms of UTI and advised not to have urine checked or be treated for UTI if not experiencing symptoms  - discussed antibiotic stewardship with the patient    - RTC in three months for OAB questionnaire and PVR  2. History of hematuria  - RUS is negative  - Today's UA is negative  - RTC in one year for UA  - patient to report any gross hematuria  3. Vaginal atrophy  - The patient is hesitant to continue her vaginal estrogen cream after reading the [facility] - I explained the transference risk using an vaginally is minimal but if she did not feel comfortable using the vaginal cream it was okay to discontinue                                           Return in about 3 months (around 03/21/2017) for PVR and OAB questionnaire.  These notes generated with voice recognition software. I apologize for typographical errors.  Zara Council, [facility] [address] [place], [place] [address] [phone]

## 2016-12-19 ENCOUNTER — Other Ambulatory Visit: Payer: Self-pay | Admitting: *Deleted

## 2016-12-19 DIAGNOSIS — R3129 Other microscopic hematuria: Secondary | ICD-10-CM

## 2016-12-20 ENCOUNTER — Ambulatory Visit (INDEPENDENT_AMBULATORY_CARE_PROVIDER_SITE_OTHER): Payer: BC Managed Care – PPO | Admitting: Urology

## 2016-12-20 ENCOUNTER — Encounter: Payer: Self-pay | Admitting: Urology

## 2016-12-20 ENCOUNTER — Other Ambulatory Visit
Admission: RE | Admit: 2016-12-20 | Discharge: 2016-12-20 | Disposition: A | Discharge status: Home / Self Care | Payer: BC Managed Care – PPO | Source: Ambulatory Visit | Attending: Urology | Admitting: Urology

## 2016-12-20 VITALS — BP 135/67 | HR 64 | Ht 65.0 in | Wt 174.0 lb

## 2016-12-20 DIAGNOSIS — N3289 Other specified disorders of bladder: Secondary | ICD-10-CM

## 2016-12-20 DIAGNOSIS — Z87448 Personal history of other diseases of urinary system: Secondary | ICD-10-CM | POA: Diagnosis not present

## 2016-12-20 DIAGNOSIS — R3129 Other microscopic hematuria: Secondary | ICD-10-CM | POA: Insufficient documentation

## 2016-12-20 DIAGNOSIS — N952 Postmenopausal atrophic vaginitis: Secondary | ICD-10-CM

## 2016-12-20 LAB — URINALYSIS, COMPLETE (UACMP) WITH MICROSCOPIC
BILIRUBIN URINE: NEGATIVE
Bacteria, UA: NONE SEEN
GLUCOSE, UA: NEGATIVE mg/dL
HGB URINE DIPSTICK: NEGATIVE
Ketones, ur: NEGATIVE mg/dL
Leukocytes, UA: NEGATIVE
NITRITE: NEGATIVE
PH: 6 (ref 5.0–8.0)
Protein, ur: NEGATIVE mg/dL
RBC / HPF: NONE SEEN RBC/hpf (ref 0–5)
SPECIFIC GRAVITY, URINE: 1.015 (ref 1.005–1.030)
WBC UA: NONE SEEN WBC/hpf (ref 0–5)

## 2016-12-20 LAB — BLADDER SCAN AMB NON-IMAGING: Scan Result: 0

## 2017-01-14 ENCOUNTER — Ambulatory Visit: Payer: Self-pay

## 2017-01-14 ENCOUNTER — Telehealth: Payer: Self-pay

## 2017-01-14 NOTE — Telephone Encounter (Signed)
Pt called and left a message on triage line. Nurse returned the call. Pt stated that she has a UTI and is with the understanding will need a cath specimen. Reinforced with pt that a u/a will be performed in office and urine will be sent for culture. Also reinforced with pt that an abx may not be given today depending on u/a results and Larene [place] likes to get ucx results back. Pt stated that he will need to be treated today and she cant wait. Pt then stated that she will go see her PCP. Reinforced with pt the purpose of waiting but also would let [facility] make final decision based on u/a results. Pt stated that she is not going to take the risk and will go see PCP.

## 2017-01-21 ENCOUNTER — Ambulatory Visit (INDEPENDENT_AMBULATORY_CARE_PROVIDER_SITE_OTHER): Payer: BC Managed Care – PPO | Admitting: General Surgery

## 2017-01-21 ENCOUNTER — Encounter: Payer: Self-pay | Admitting: General Surgery

## 2017-01-21 VITALS — BP 138/78 | HR 70 | Resp 12 | Ht 65.0 in | Wt 170.0 lb

## 2017-01-21 DIAGNOSIS — K621 Rectal polyp: Secondary | ICD-10-CM | POA: Diagnosis not present

## 2017-01-21 DIAGNOSIS — D128 Benign neoplasm of rectum: Secondary | ICD-10-CM | POA: Insufficient documentation

## 2017-01-21 NOTE — Progress Notes (Signed)
Patient ID: Tonya Randolph, female   DOB: 12/28/1955, 61 y.o.   MRN: [id number]  Chief Complaint  Patient presents with  . Procedure    rigid sigmoidoscopy    HPI Tonya Randolph is a 61 y.o. female.  Here today for Follow-up rigid sigmoidoscopy. She did complete the enema this morning. Mild cramping and gas after enemas.    HPI  Past Medical History:  Diagnosis Date  . Anemia 2014  . Brachial plexus injury   . 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 Colon polyp 2017  . GERD (gastroesophageal reflux disease)   . Heart murmur     Past Surgical History:  Procedure Laterality Date  . basal cell removal  2017   left arm  . CARPAL TUNNEL RELEASE  1985  . COLON SURGERY  2017   tumor removed from colon  . 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 COLONOSCOPY WITH PROPOFOL N/A 12/04/2015   Procedure: COLONOSCOPY WITH PROPOFOL;  Surgeon: Lollie Sails, MD;  Location: [facility] ENDOSCOPY;  Service: Endoscopy;  Laterality: N/A;  . TONSILLECTOMY  1973  . TUBAL LIGATION  1986    Family History  Problem Relation Age of Onset  . Cancer Mother        ovarian  . Kidney cancer Neg Hx   . Bladder Cancer Neg Hx     Social History Social History  Substance Use Topics  . Smoking status: Never Smoker  . Smokeless tobacco: Never Used  . Alcohol use Yes     Comment: rare    No Known Allergies  Current Outpatient Prescriptions  Medication Sig Dispense Refill  . cetirizine (ZYRTEC) 10 MG tablet Take 10 mg by mouth every morning.     . conjugated estrogens (PREMARIN) vaginal cream Place 1 Applicatorful vaginally daily. Apply 0.5mg  (pea-sized amount)  just inside the vaginal introitus with a finger-tip on Monday, Wednesday and Friday nights. 30 g 12  . omeprazole (PRILOSEC) 20 MG capsule Take 20 mg by mouth daily.      No current facility-administered medications for this visit.     Review of Systems Review of Systems  Constitutional: Negative.   Respiratory: Negative.   Cardiovascular: Negative.     Blood pressure 138/78, pulse 70, resp. rate 12, height 5\' 5"  (1.651 m), weight 170 lb (77.1 kg).  Physical Exam Physical Exam  Constitutional: She is oriented to person, place, and time. She appears well-developed and well-nourished.  HENT:  Mouth/Throat: Oropharynx is clear and moist.  Eyes: Conjunctivae are normal. No scleral icterus.  Neck: Neck supple.  Cardiovascular: Normal rate, regular rhythm and normal heart sounds.   Pulmonary/Chest: Effort normal and breath sounds normal.  Abdominal: Soft. She exhibits no distension. Tenderness: Mild hypogastric tenderness to palpation.  Neurological: She is alert and oriented to person, place, and time.  Skin: Skin is warm and dry.  Psychiatric: Her behavior is normal.    Data Reviewed Rigid sigmoidoscopy was completed in the left lateral decubitus position. The scope was advanced to well above the ink at 12 cm. At 3015 cm mark there was discomfort from a slight angulation the colon. The ink had been placed immediately at the site of the polypectomy at the time of her 2017 colonoscopy. The mucosa in this area was perfectly smooth. No evidence  of recurrent tubulovillous polyp.  Assessment    No evidence of recurrent polyp.    Plan    The patient was advised that a follow-up colonoscopy should be done in one year based on the high-grade dysplasia of the lesion resected in 2017. The opportunity to have this completed by Dr. Gustavo Lah or myself was reviewed.  At this time, the patient will return here in 1 year for for a repeat colonoscopy.      She will need colonoscopy next year.   HPI, Physical Exam, Assessment and Plan have been scribed under the direction and in the presence of Robert Bellow, MD. Karie Fetch, RN  I have completed  the exam and reviewed the above documentation for accuracy and completeness.  I agree with the above.  Haematologist has been used and any errors in dictation or transcription are unintentional.  Hervey Ard, M.D., F.A.C.S.  Robert Bellow 01/21/2017, 10:22 AM

## 2017-01-21 NOTE — Patient Instructions (Addendum)
The patient is aware to call back for any questions or concerns. She will need colonoscopy next year

## 2017-01-24 NOTE — Progress Notes (Signed)
Thank you :)

## 2017-03-21 ENCOUNTER — Ambulatory Visit: Payer: BC Managed Care – PPO | Admitting: Urology

## 2017-07-03 ENCOUNTER — Other Ambulatory Visit: Payer: Self-pay | Admitting: Family Medicine

## 2017-07-03 DIAGNOSIS — J986 Disorders of diaphragm: Secondary | ICD-10-CM

## 2017-07-14 ENCOUNTER — Ambulatory Visit
Admission: RE | Admit: 2017-07-14 | Discharge: 2017-07-14 | Disposition: A | Discharge status: Home / Self Care | Payer: BC Managed Care – PPO | Source: Ambulatory Visit | Attending: Family Medicine | Admitting: Family Medicine

## 2017-07-14 DIAGNOSIS — J986 Disorders of diaphragm: Secondary | ICD-10-CM | POA: Diagnosis present

## 2017-07-14 DIAGNOSIS — J9811 Atelectasis: Secondary | ICD-10-CM | POA: Insufficient documentation

## 2017-07-14 DIAGNOSIS — Q791 Other congenital malformations of diaphragm: Secondary | ICD-10-CM | POA: Insufficient documentation

## 2017-07-14 LAB — POCT I-STAT CREATININE: Creatinine, Ser: 0.7 mg/dL (ref 0.44–1.00)

## 2017-07-14 IMAGING — CT CT CHEST W/ CM
2 of 4 series · 15 of 36 positions shown, 18 images · IV contrast (omnipaque)
Comparison: None available currently.

CLINICAL DATA: Elevated hemidiaphragm, cough.

EXAM:
CT CHEST WITH CONTRAST
TECHNIQUE: Multidetector CT imaging of the chest was performed during
intravenous contrast administration.
CONTRAST:  100mL OMNIPAQUE IOHEXOL 300 MG/ML  SOLN

[Series 2: axial chest · axial · 0.58mm/px · z∈[-1211,-939]mm · 12 of 162 slices shown, 15 images]
[im 13/162  mediastinal]
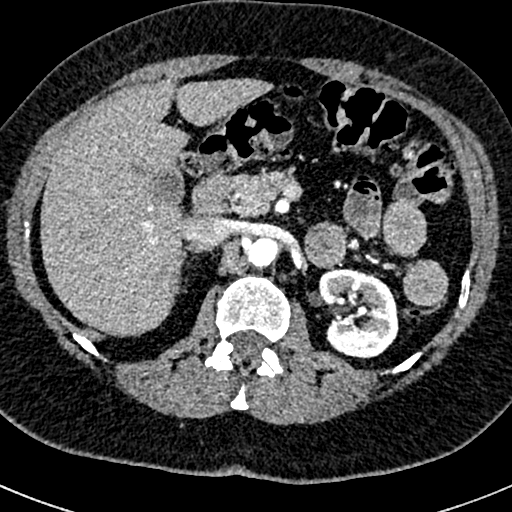
[im 13/162  lung]
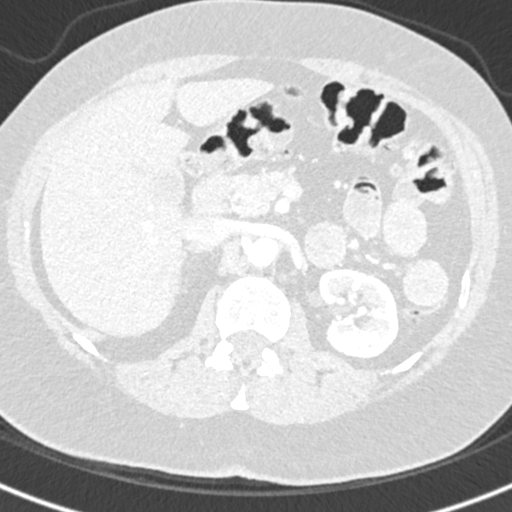
[im 25/162  lung]
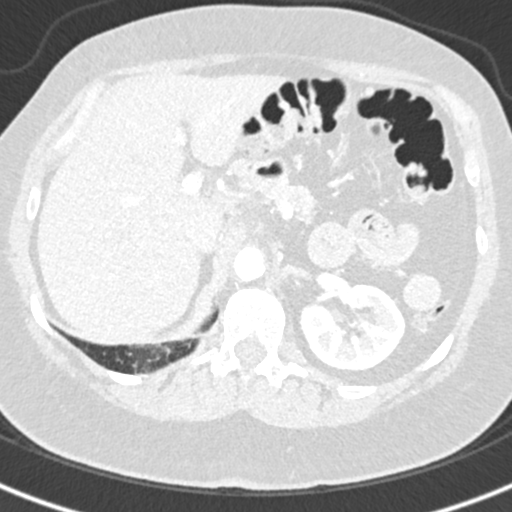
[im 38/162  lung]
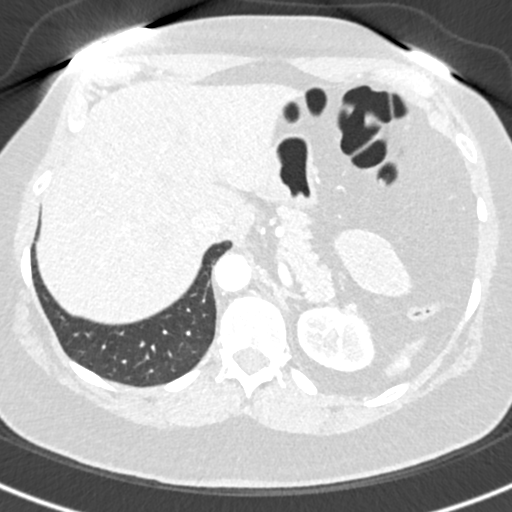
[im 50/162  lung]
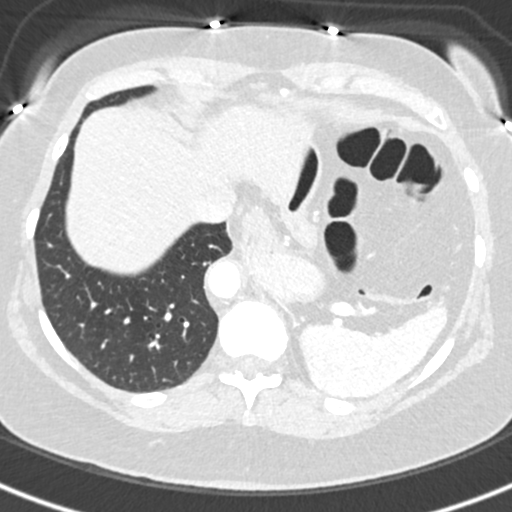
[im 62/162  mediastinal]
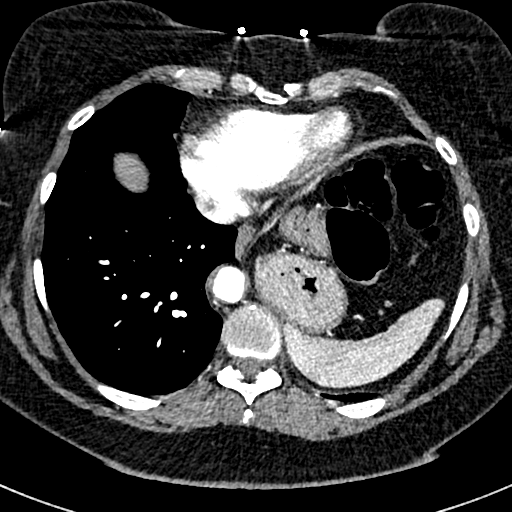
[im 62/162  lung]
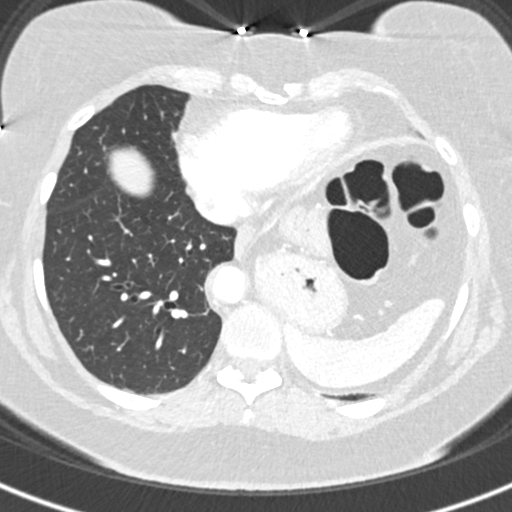
[im 75/162  lung]
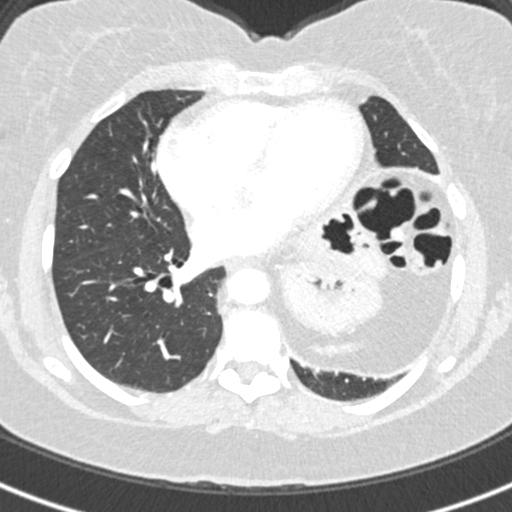
[im 87/162  lung]
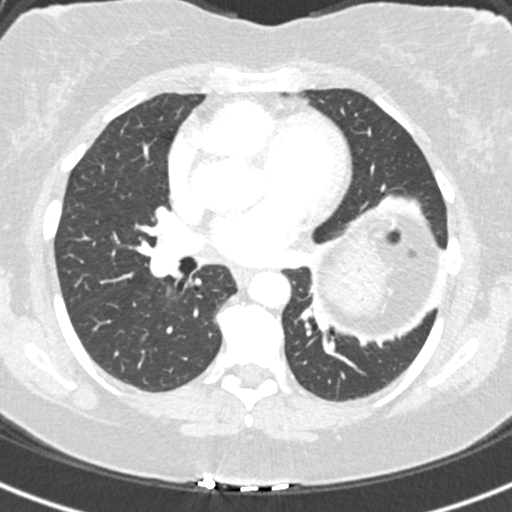
[im 100/162  lung]
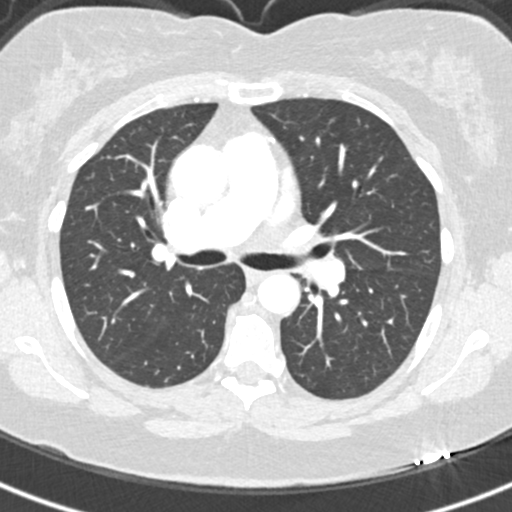
[im 112/162  mediastinal]
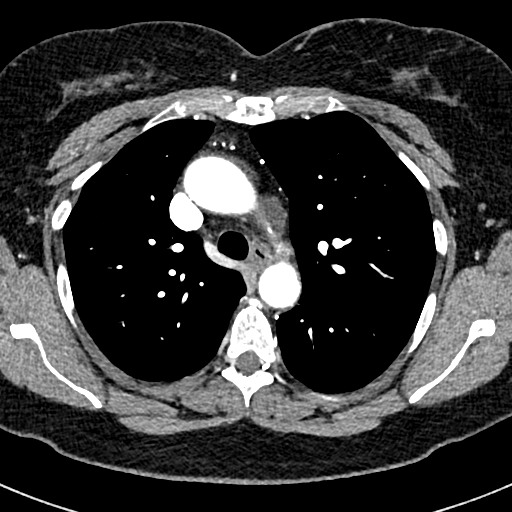
[im 112/162  lung]
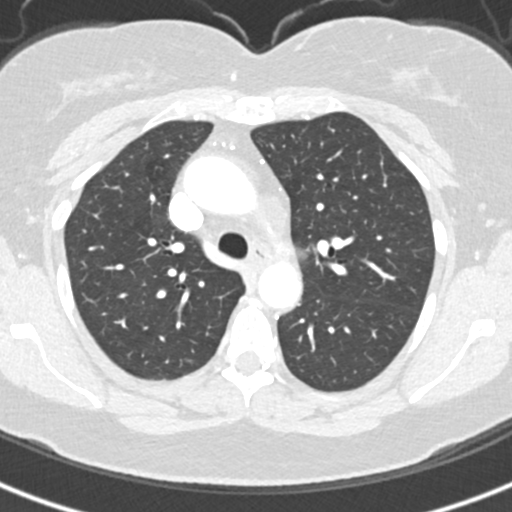
[im 124/162  lung]
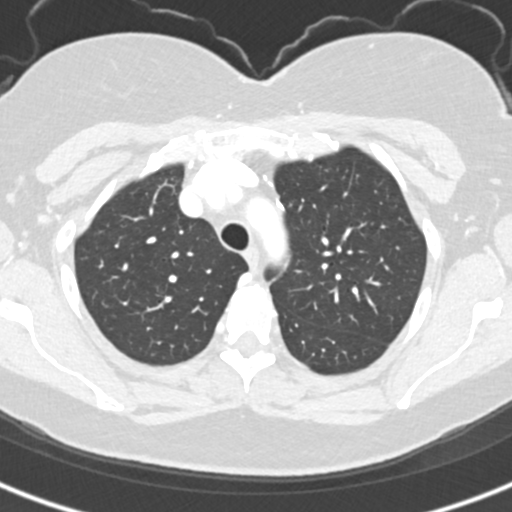
[im 137/162  lung]
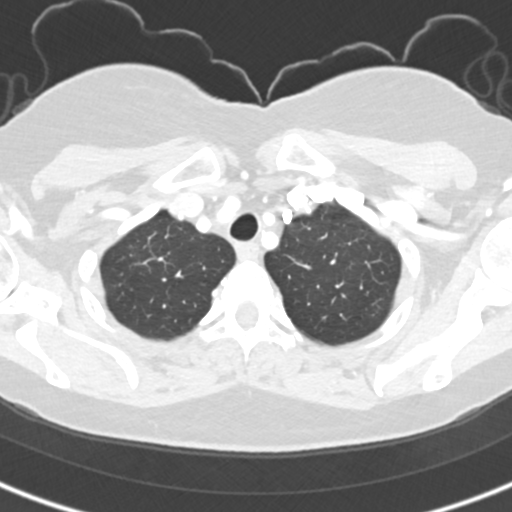
[im 149/162  lung]
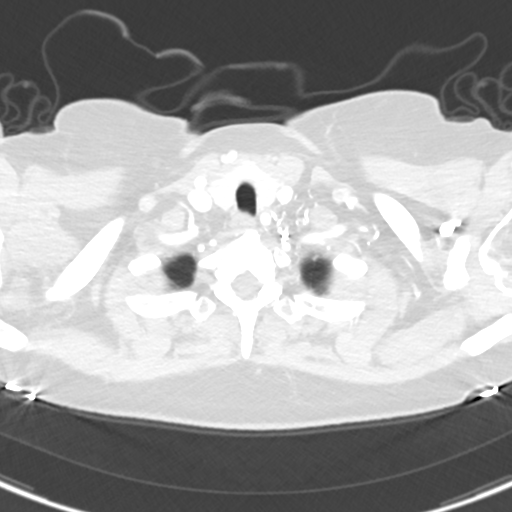

[Series 4: coronal chest · coronal · 0.58mm/px · 3 of 148 slices shown]
[im 30/148  lung]
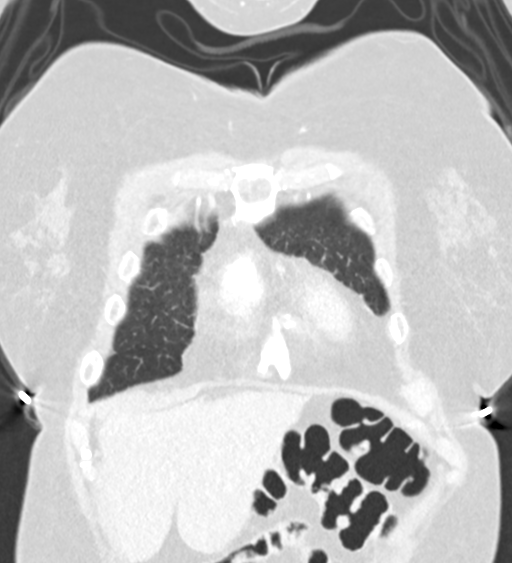
[im 59/148  lung]
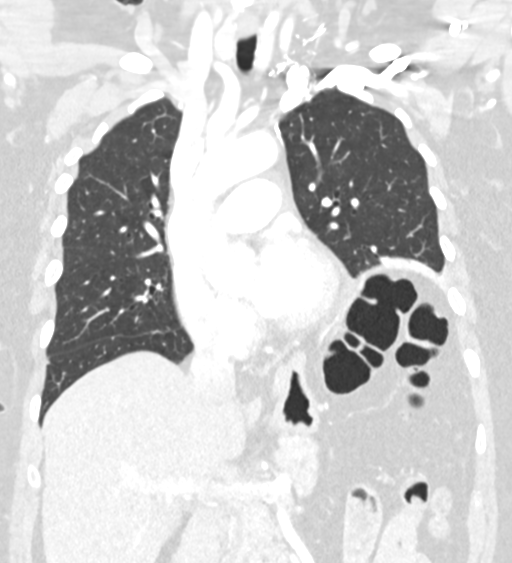
[im 89/148  lung]
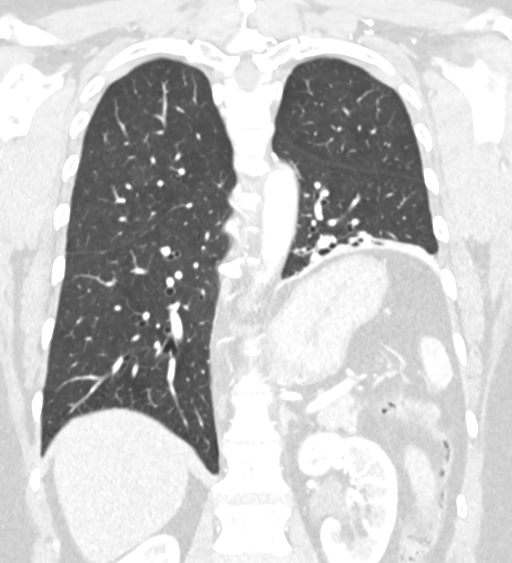

[15 of 36 positions shown; findings below may reference images not displayed]

FINDINGS: Cardiovascular: No significant vascular findings. Normal heart size.
No pericardial effusion.

Mediastinum/Nodes: No enlarged mediastinal, hilar, or axillary lymph
nodes. Thyroid gland, trachea, and esophagus demonstrate no
significant findings.

Lungs/Pleura: No pneumothorax or pleural effusion is noted. Right
lung is clear. Mild left basilar subsegmental atelectasis is noted.

Upper Abdomen: Elevated left hemidiaphragm is noted. No other
abnormality seen in visualized portion of upper abdomen.

Musculoskeletal: No chest wall abnormality. No acute or significant
osseous findings.
IMPRESSION: Elevated left hemidiaphragm is noted with associated mild left
basilar subsegmental atelectasis. No other abnormality seen in the
chest.

## 2017-07-14 MED ORDER — IOHEXOL 300 MG/ML  SOLN
100.0000 mL | Freq: Once | INTRAMUSCULAR | Status: AC | PRN
  Administered 2017-07-14: 100 mL via INTRAVENOUS

## 2018-01-27 ENCOUNTER — Encounter: Payer: Self-pay | Admitting: General Surgery

## 2018-01-27 ENCOUNTER — Other Ambulatory Visit: Payer: Self-pay

## 2018-01-27 ENCOUNTER — Ambulatory Visit: Payer: BC Managed Care – PPO | Admitting: General Surgery

## 2018-01-27 VITALS — BP 133/79 | HR 62 | Temp 97.7°F | Resp 12 | Ht 65.5 in | Wt 168.0 lb

## 2018-01-27 DIAGNOSIS — D128 Benign neoplasm of rectum: Secondary | ICD-10-CM

## 2018-01-27 MED ORDER — POLYETHYLENE GLYCOL 3350 17 GM/SCOOP PO POWD: 1.0000 | Freq: Once | ORAL | 0 refills | Status: AC

## 2018-01-27 NOTE — Patient Instructions (Addendum)
The patient is aware to call back for any questions or concerns.  Colonoscopy, Adult A colonoscopy is an exam to look at the entire large intestine. During the exam, a lubricated, bendable tube is inserted into the anus and then passed into the rectum, colon, and other parts of the large intestine. A colonoscopy is often done as a part of normal colorectal screening or in response to certain symptoms, such as anemia, persistent diarrhea, abdominal pain, and blood in the stool. The exam can help screen for and diagnose medical problems, including:  Tumors.  Polyps.  Inflammation.  Areas of bleeding.  Tell a health care provider about:  Any allergies you have.  All medicines you are taking, including vitamins, herbs, eye drops, creams, and over-the-counter medicines.  Any problems you or family members have had with anesthetic medicines.  Any blood disorders you have.  Any surgeries you have had.  Any medical conditions you have.  Any problems you have had passing stool. What are the risks? Generally, this is a safe procedure. However, problems may occur, including:  Bleeding.  A tear in the intestine.  A reaction to medicines given during the exam.  Infection (rare).  What happens before the procedure? Eating and drinking restrictions Follow instructions from your health care provider about eating and drinking, which may include:  A few days before the procedure - follow a low-fiber diet. Avoid nuts, seeds, dried fruit, raw fruits, and vegetables.  1-3 days before the procedure - follow a clear liquid diet. Drink only clear liquids, such as clear broth or bouillon, black coffee or tea, clear juice, clear soft drinks or sports drinks, gelatin dessert, and popsicles. Avoid any liquids that contain red or purple dye.  On the day of the procedure - do not eat or drink anything during the 2 hours before the procedure, or within the time period that your health care provider  recommends.  Bowel prep If you were prescribed an oral bowel prep to clean out your colon:  Take it as told by your health care provider. Starting the day before your procedure, you will need to drink a large amount of medicated liquid. The liquid will cause you to have multiple loose stools until your stool is almost clear or light green.  If your skin or anus gets irritated from diarrhea, you may use these to relieve the irritation: ? Medicated wipes, such as adult wet wipes with aloe and vitamin E. ? A skin soothing-product like petroleum jelly.  If you vomit while drinking the bowel prep, take a break for up to 60 minutes and then begin the bowel prep again. If vomiting continues and you cannot take the bowel prep without vomiting, call your health care provider.  General instructions  Ask your health care provider about changing or stopping your regular medicines. This is especially important if you are taking diabetes medicines or blood thinners.  Plan to have someone take you home from the hospital or clinic. What happens during the procedure?  An IV tube may be inserted into one of your veins.  You will be given medicine to help you relax (sedative).  To reduce your risk of infection: ? Your health care team will wash or sanitize their hands. ? Your anal area will be washed with soap.  You will be asked to lie on your side with your knees bent.  Your health care provider will lubricate a long, thin, flexible tube. The tube will have a camera and   a light on the end.  The tube will be inserted into your anus.  The tube will be gently eased through your rectum and colon.  Air will be delivered into your colon to keep it open. You may feel some pressure or cramping.  The camera will be used to take images during the procedure.  A small tissue sample may be removed from your body to be examined under a microscope (biopsy). If any potential problems are found, the tissue  will be sent to a lab for testing.  If small polyps are found, your health care provider may remove them and have them checked for cancer cells.  The tube that was inserted into your anus will be slowly removed. The procedure may vary among health care providers and hospitals. What happens after the procedure?  Your blood pressure, heart rate, breathing rate, and blood oxygen level will be monitored until the medicines you were given have worn off.  Do not drive for 24 hours after the exam.  You may have a small amount of blood in your stool.  You may pass gas and have mild abdominal cramping or bloating due to the air that was used to inflate your colon during the exam.  It is up to you to get the results of your procedure. Ask your health care provider, or the department performing the procedure, when your results will be ready. This information is not intended to replace advice given to you by your health care provider. Make sure you discuss any questions you have with your health care provider. Document Released: 03/29/2000 Document Revised: 01/31/2016 Document Reviewed: 06/13/2015 Elsevier Interactive Patient Education  [employer].   The patient is scheduled for a Colonoscopy at [facility] on 02/11/18. They are aware to call the day before to get their arrival time. Miralax prescription has been sent into the patient's pharmacy. The patient is aware of date and instructions.

## 2018-01-27 NOTE — Progress Notes (Signed)
Patient ID: Tonya Randolph, female   DOB: Aug 15, 1955, 62 y.o.   MRN: [id number]  Chief Complaint  Patient presents with  . Colonoscopy    HPI Tonya Randolph is a 62 y.o. female.  Who presents for a colonoscopy discussion. The last colonoscopy was completed on 01-10-16, tubulovillous polyp with dysplasia. Denies any gastrointestinal issues. Bowels move regular, every other day, and no bleeding noted. No further bowel issues or incontinence.  This had been present prior to resection of the polyp in 2017. CT and x ray done at [facility] and has questions and concerns regarding the safety of a colonoscopy and sedation.  She is Engineer, technical sales at [employer].  HPI  Past Medical History:  Diagnosis Date  . Anemia 2014  . Brachial plexus injury   . 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  Marland Kitchen GERD (gastroesophageal reflux disease)   . Heart murmur     Past Surgical History:  Procedure Laterality Date  . basal cell removal  2017   left arm  . CARPAL TUNNEL RELEASE  1985  . COLON SURGERY  2017   tumor removed from colon  . COLONOSCOPY N/A 01/12/2016   Tubulovillous polyp with high-grade dysplasia removed from the rectum at 12 cm.;  Surgeon: Tonya Randolph;  Location: [facility] ORS;  Service: General;  Laterality: N/A;  . COLONOSCOPY WITH PROPOFOL N/A 12/04/2015   Procedure: COLONOSCOPY WITH PROPOFOL;  Surgeon: Tonya Randolph;  Location: [facility] ENDOSCOPY;  Service: Endoscopy;  Laterality: N/A;  . TONSILLECTOMY  1973  . TUBAL LIGATION  1986    Family History  Problem Relation Age of Onset  . Cancer Mother        ovarian  . Kidney cancer Neg Hx   . Bladder Cancer Neg Hx     Social History Social History   Tobacco Use  . Smoking status: Never Smoker  . Smokeless tobacco: Never Used  Substance Use Topics  . Alcohol  use: Yes    Comment: rare  . Drug use: No    No Known Allergies  Current Outpatient Medications  Medication Sig Dispense Refill  . cetirizine (ZYRTEC) 10 MG tablet Take 10 mg by mouth every morning.     Marland Kitchen omeprazole (PRILOSEC) 20 MG capsule Take 20 mg by mouth daily.    . polyethylene glycol powder (GLYCOLAX/MIRALAX) powder Take 255 g by mouth once for 1 dose. Mix whole container with 64 ounces of clear liquids, No Red liquids. 255 g 0   No current facility-administered medications for this visit.     Review of Systems Review of Systems  Constitutional: Negative.   Respiratory: Negative.   Cardiovascular: Negative.     Blood pressure 133/79, pulse 62, temperature 97.7 F (36.5 C), temperature source Skin, resp. rate 12, height 5' 5.5" (1.664 m), weight 168 lb (76.2 kg), SpO2 97 %.  Physical Exam Physical Exam  Constitutional: She is oriented to person, place, and time. She appears well-developed and well-nourished.  HENT:  Mouth/Throat: Oropharynx is clear and moist. No oropharyngeal exudate.  Eyes: Conjunctivae are normal. No scleral icterus.  Neck: Neck supple.  Cardiovascular: Normal rate and normal heart sounds.  Pulmonary/Chest: Effort normal. She has decreased breath sounds (left lower lobe.).  Lymphadenopathy:    She has no cervical adenopathy.  Neurological: She is alert and  oriented to person, place, and time.  Skin: Skin is warm and dry.  Psychiatric: Her behavior is normal.    Data Reviewed Chest x-ray report reviewed.  Elevation of the left hemidiaphragm.  Chest CT of 2019 and abdominopelvic CT of 2016 reviewed.  Left hemidiaphragm elevation is new.  Atelectasis in the left lower lobe.  Assessment    Status post endoscopic resection of a sizable tubulovillous adenoma the colon.    Plan    [facility] films reviewed, safe for procedure. Will discuss with Thoracic surgeon as well.  Colonoscopy with possible biopsy/polypectomy prn: Information regarding the  procedure, including its potential risks and complications (including but not limited to perforation of the bowel, which may require emergency surgery to repair, and bleeding) was verbally given to the patient. Educational information regarding lower intestinal endoscopy was given to the patient. Written instructions for how to complete the bowel prep using Miralax were provided. The importance of drinking ample fluids to avoid dehydration as a result of the prep emphasized.      HPI, Physical Exam, Assessment and Plan have been scribed under the direction and in the presence of Tonya Randolph. Karie Fetch, RN  I have completed the exam and reviewed the above documentation for accuracy and completeness.  I agree with the above.  Haematologist has been used and any errors in dictation or transcription are unintentional.  Tonya Ard, M.D., F.A.C.S.  The patient is scheduled for a Colonoscopy at [facility] on 02/11/18. They are aware to call the day before to get their arrival time. Miralax prescription has been sent into the patient's pharmacy. The patient is aware of date and instructions.  Documented by Caryl-Lyn Otis Brace LPN   Forest Gleason Keeanna Villafranca 01/27/2018, 8:12 PM

## 2018-02-11 ENCOUNTER — Ambulatory Visit: Payer: BC Managed Care – PPO | Admitting: Anesthesiology

## 2018-02-11 ENCOUNTER — Ambulatory Visit
Admission: RE | Admit: 2018-02-11 | Discharge: 2018-02-11 | Disposition: A | Discharge status: Home / Self Care | Payer: BC Managed Care – PPO | Source: Ambulatory Visit | Attending: General Surgery | Admitting: General Surgery

## 2018-02-11 ENCOUNTER — Encounter
Admission: RE | Disposition: A | Discharge status: Home / Self Care | Payer: Self-pay | Source: Ambulatory Visit | Attending: General Surgery

## 2018-02-11 ENCOUNTER — Encounter: Payer: Self-pay | Admitting: Anesthesiology

## 2018-02-11 DIAGNOSIS — Z1211 Encounter for screening for malignant neoplasm of colon: Secondary | ICD-10-CM | POA: Diagnosis not present

## 2018-02-11 DIAGNOSIS — D649 Anemia, unspecified: Secondary | ICD-10-CM | POA: Insufficient documentation

## 2018-02-11 DIAGNOSIS — Z8041 Family history of malignant neoplasm of ovary: Secondary | ICD-10-CM | POA: Insufficient documentation

## 2018-02-11 DIAGNOSIS — Z79899 Other long term (current) drug therapy: Secondary | ICD-10-CM | POA: Insufficient documentation

## 2018-02-11 DIAGNOSIS — K5730 Diverticulosis of large intestine without perforation or abscess without bleeding: Secondary | ICD-10-CM | POA: Diagnosis not present

## 2018-02-11 DIAGNOSIS — Z85828 Personal history of other malignant neoplasm of skin: Secondary | ICD-10-CM | POA: Insufficient documentation

## 2018-02-11 DIAGNOSIS — K219 Gastro-esophageal reflux disease without esophagitis: Secondary | ICD-10-CM | POA: Diagnosis not present

## 2018-02-11 DIAGNOSIS — Z86010 Personal history of colonic polyps: Secondary | ICD-10-CM

## 2018-02-11 DIAGNOSIS — D128 Benign neoplasm of rectum: Secondary | ICD-10-CM

## 2018-02-11 DIAGNOSIS — R011 Cardiac murmur, unspecified: Secondary | ICD-10-CM | POA: Insufficient documentation

## 2018-02-11 HISTORY — PX: COLONOSCOPY WITH PROPOFOL: SHX5780

## 2018-02-11 SURGERY — COLONOSCOPY WITH PROPOFOL
Anesthesia: General

## 2018-02-11 MED ORDER — PROPOFOL 10 MG/ML IV BOLUS
INTRAVENOUS | Status: DC | PRN
  Administered 2018-02-11: 90 mg via INTRAVENOUS

## 2018-02-11 MED ORDER — LIDOCAINE HCL (CARDIAC) PF 100 MG/5ML IV SOSY
PREFILLED_SYRINGE | INTRAVENOUS | Status: DC | PRN
  Administered 2018-02-11: 50 mg via INTRAVENOUS

## 2018-02-11 MED ORDER — PROPOFOL 500 MG/50ML IV EMUL
INTRAVENOUS | Status: DC | PRN
  Administered 2018-02-11: 135 ug/kg/min via INTRAVENOUS

## 2018-02-11 MED ORDER — PROPOFOL 500 MG/50ML IV EMUL
INTRAVENOUS | Status: AC
  Filled 2018-02-11: qty 50

## 2018-02-11 MED ORDER — SODIUM CHLORIDE 0.9 % IV SOLN
INTRAVENOUS | Status: DC
  Administered 2018-02-11: 1000 mL via INTRAVENOUS

## 2018-02-11 MED ORDER — LIDOCAINE HCL (PF) 2 % IJ SOLN
INTRAMUSCULAR | Status: AC
  Filled 2018-02-11: qty 10

## 2018-02-11 NOTE — Transfer of Care (Signed)
Immediate Anesthesia Transfer of Care Note  Patient: Tonya Randolph  Procedure(s) Performed: COLONOSCOPY WITH PROPOFOL (N/A )  Patient Location: PACU and Endoscopy Unit  Anesthesia Type:General  Level of Consciousness: awake, alert , oriented and patient cooperative  Airway & Oxygen Therapy: Patient Spontanous Breathing  Post-op Assessment: Report given to RN and Post -op Vital signs reviewed and stable  Post vital signs: Reviewed and stable  Last Vitals:  Vitals Value Taken Time  BP 97/54 02/11/2018  9:07 AM  Temp 36.1 C 02/11/2018  9:07 AM  Pulse 78 02/11/2018  9:07 AM  Resp 25 02/11/2018  9:07 AM  SpO2 94 % 02/11/2018  9:07 AM    Last Pain:  Vitals:   02/11/18 0907  TempSrc: Tympanic  PainSc: 0-No pain         Complications: No apparent anesthesia complications

## 2018-02-11 NOTE — Anesthesia Postprocedure Evaluation (Signed)
Anesthesia Post Note  Patient: Tonya Randolph  Procedure(s) Performed: COLONOSCOPY WITH PROPOFOL (N/A )  Patient location during evaluation: Endoscopy Anesthesia Type: General Level of consciousness: awake and alert Pain management: pain level controlled Vital Signs Assessment: post-procedure vital signs reviewed and stable Respiratory status: spontaneous breathing, nonlabored ventilation, respiratory function stable and patient connected to nasal cannula oxygen Cardiovascular status: blood pressure returned to baseline and stable Postop Assessment: no apparent nausea or vomiting Anesthetic complications: no     Last Vitals:  Vitals:   02/11/18 0757 02/11/18 0907  BP: (!) 109/94 (!) 97/54  Pulse: 63 78  Resp: 16 (!) 25  Temp: 36.5 C (!) 36.1 C  SpO2: 99% 94%    Last Pain:  Vitals:   02/11/18 0937  TempSrc:   PainSc: 0-No pain                 Christinia Lambeth S

## 2018-02-11 NOTE — Op Note (Signed)
[facility] Patient Name: Tonya Randolph Procedure Date: 02/11/2018 8:32 AM MRN: [id number] Account #: [id number] Date of Birth: 1955-10-10 Admit Type: Outpatient Age: 62 Room: [facility] ENDO ROOM 1 Gender: Female Note Status: Finalized Procedure:            Colonoscopy Indications:          High risk colon cancer surveillance: Personal history                        of colonic polyps Providers:            Robert Bellow, MD Referring MD:         [name] (Referring MD) Medicines:            Monitored Anesthesia Care Complications:        No immediate complications. Procedure:            Pre-Anesthesia Assessment:                       - Prior to the procedure, a History and Physical was                        performed, and patient medications, allergies and                        sensitivities were reviewed. The patient's tolerance of                        previous anesthesia was reviewed.                       - The risks and benefits of the procedure and the                        sedation options and risks were discussed with the                        patient. All questions were answered and informed                        consent was obtained.                       After obtaining informed consent, the colonoscope was                        passed under direct vision. Throughout the procedure,                        the patient's blood pressure, pulse, and oxygen                        saturations were monitored continuously. The                        Colonoscope was introduced through the anus and                        advanced to the the cecum, identified by appendiceal  orifice and ileocecal valve. The colonoscopy was                        performed without difficulty. The patient tolerated the                        procedure well. The quality of the bowel preparation                        was  excellent. Findings:      A few medium-mouthed diverticula were found in the sigmoid colon.      The retroflexed view of the distal rectum and anal verge was normal and       showed no anal or rectal abnormalities. Impression:           - Diverticulosis in the sigmoid colon.                       - The distal rectum and anal verge are normal on                        retroflexion view.                       - No specimens collected. Recommendation:       - Repeat colonoscopy in 5 years for surveillance. Procedure Code(s):    --- Professional ---                       [phone], Colonoscopy, flexible; diagnostic, including                        collection of specimen(s) by brushing or washing, when                        performed (separate procedure) Diagnosis Code(s):    --- Professional ---                       Z86.010, Personal history of colonic polyps                       K57.30, Diverticulosis of large intestine without                        perforation or abscess without bleeding CPT copyright 2018 American Medical Association. All rights reserved. The codes documented in this report are preliminary and upon coder review may  be revised to meet current compliance requirements. Robert Bellow, MD 02/11/2018 9:05:50 AM This report has been signed electronically. Number of Addenda: 0 Note Initiated On: 02/11/2018 8:32 AM Scope Withdrawal Time: 0 hours 7 minutes 34 seconds  Total Procedure Duration: 0 hours 15 minutes 11 seconds       [facility]

## 2018-02-11 NOTE — H&P (Signed)
No change in clinical history or exam. Tolerated prep with some nausea, no vomiting. Plan: Colonoscopy to follow up previously resected polyp of the rectosigmoid.

## 2018-02-11 NOTE — Anesthesia Post-op Follow-up Note (Signed)
Anesthesia QCDR form completed.        

## 2018-02-11 NOTE — Anesthesia Preprocedure Evaluation (Signed)
Anesthesia Evaluation  Patient identified by MRN, date of birth, ID band Patient awake    Reviewed: Allergy & Precautions, NPO status , Patient's Chart, lab work & pertinent test results, reviewed documented beta blocker date and time   Airway Mallampati: III  TM Distance: >3 FB     Dental  (+) Chipped   Pulmonary           Cardiovascular + Valvular Problems/Murmurs      Neuro/Psych  Neuromuscular disease    GI/Hepatic GERD  ,  Endo/Other    Renal/GU      Musculoskeletal   Abdominal   Peds  Hematology  (+) anemia ,   Anesthesia Other Findings   Reproductive/Obstetrics                             Anesthesia Physical Anesthesia Plan  ASA: II  Anesthesia Plan: General   Post-op Pain Management:    Induction: Intravenous  PONV Risk Score and Plan:   Airway Management Planned:   Additional Equipment:   Intra-op Plan:   Post-operative Plan:   Informed Consent: I have reviewed the patients History and Physical, chart, labs and discussed the procedure including the risks, benefits and alternatives for the proposed anesthesia with the patient or authorized representative who has indicated his/her understanding and acceptance.     Plan Discussed with: CRNA  Anesthesia Plan Comments:         Anesthesia Quick Evaluation

## 2018-02-12 ENCOUNTER — Encounter: Payer: Self-pay | Admitting: General Surgery

## 2019-07-30 ENCOUNTER — Other Ambulatory Visit: Payer: Self-pay | Admitting: Neurology

## 2019-07-30 DIAGNOSIS — I639 Cerebral infarction, unspecified: Secondary | ICD-10-CM

## 2019-08-13 ENCOUNTER — Ambulatory Visit
Admission: RE | Admit: 2019-08-13 | Discharge: 2019-08-13 | Disposition: A | Discharge status: Home / Self Care | Payer: BC Managed Care – PPO | Source: Ambulatory Visit | Attending: Neurology | Admitting: Neurology

## 2019-08-13 ENCOUNTER — Other Ambulatory Visit: Payer: Self-pay

## 2019-08-13 DIAGNOSIS — I639 Cerebral infarction, unspecified: Secondary | ICD-10-CM | POA: Diagnosis present

## 2019-08-13 IMAGING — MR MR HEAD W/O CM
17 series · 45 of 48 positions shown · non-contrast
Comparison: None.

CLINICAL DATA: Cerebrovascular accident.

EXAM:
MRI HEAD WITHOUT CONTRAST
TECHNIQUE: Multiplanar, multiecho pulse sequences of the brain and surrounding
structures were obtained without intravenous contrast.

[Series 5: ax dwi_tracew · axial · 3.0mm · 0.60mm/px · z∈[-95,+50]mm · 3 of 46 slices shown]
[im 1/46]
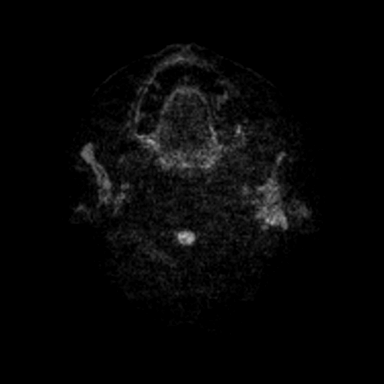
[im 23/46]
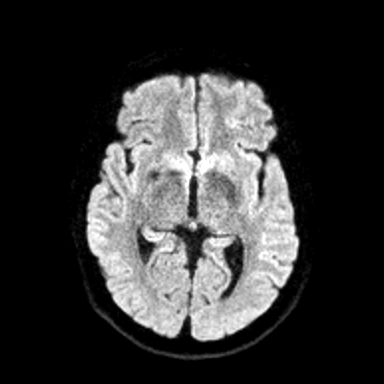
[im 46/46]
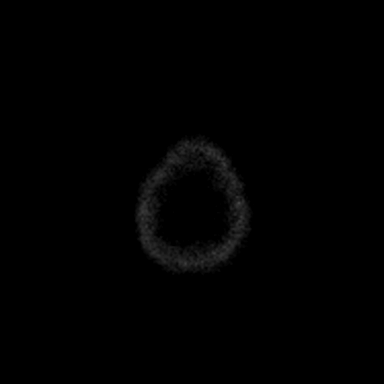

[Series 6: ax dwi_adc · axial · 3.0mm · 0.60mm/px · z∈[-95,+50]mm · 3 of 46 slices shown]
[im 1/46]
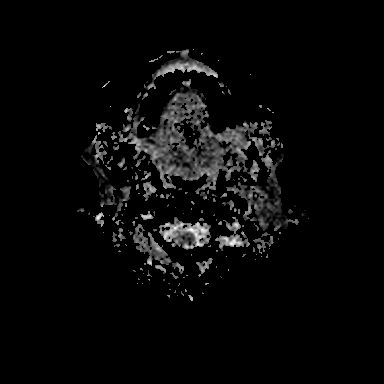
[im 23/46]
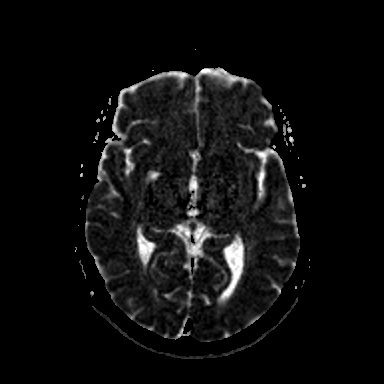
[im 46/46]
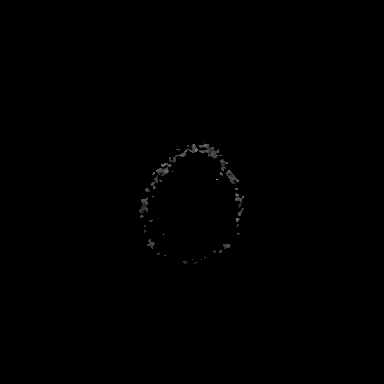

[Series 7: cor dwi_tracew · coronal · 5.0mm · 0.60mm/px · 2 of 34 slices shown]
[im 1/34]
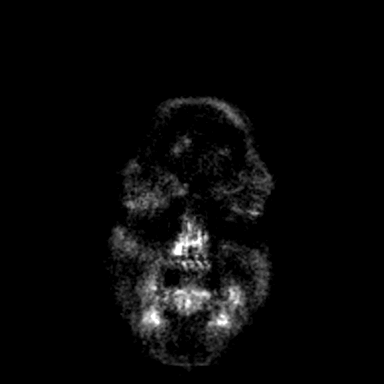
[im 34/34]
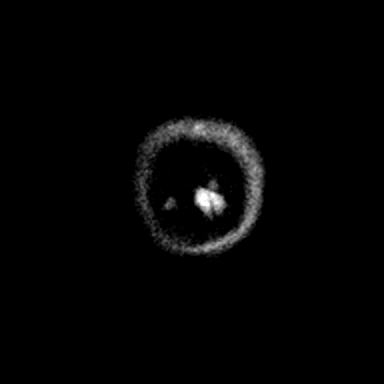

[Series 8: cor dwi_adc · coronal · 5.0mm · 0.60mm/px · 2 of 34 slices shown]
[im 1/34]
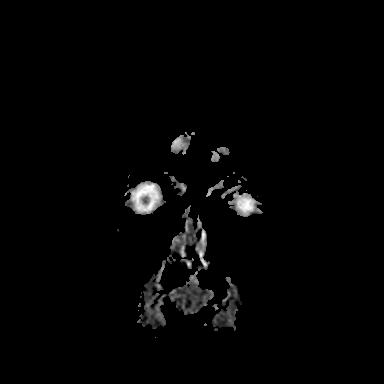
[im 34/34]
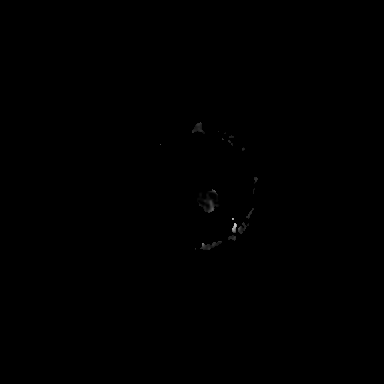

[Series 9: T1 · sagittal · 5.0mm · 0.62mm/px · 1 of 20 slices shown (1 of 2)]
[im 1/20]
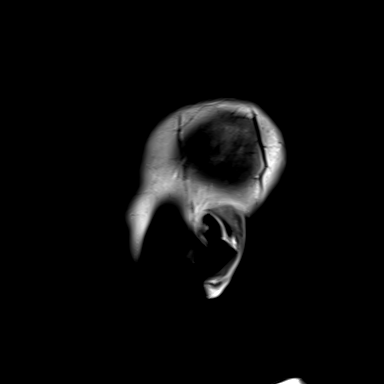

[Series 10: T2 · axial · 5.0mm · 0.53mm/px · z∈[-89,+45]mm · 2 of 24 slices shown (1 of 4)]
[im 1/24]
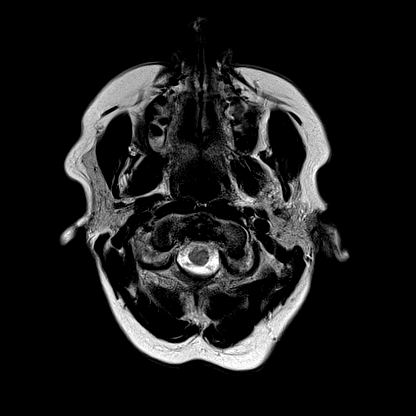
[im 24/24]
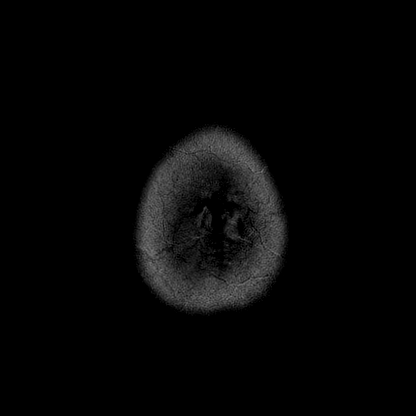

[Series 11: mag_images · axial · 3.0mm · 0.90mm/px · z∈[-109,+63]mm · 4 of 60 slices shown]
[im 1/60]
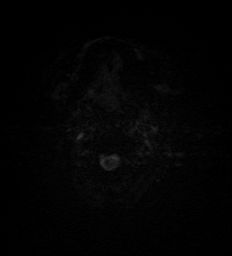
[im 20/60]
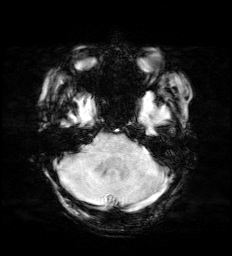
[im 40/60]
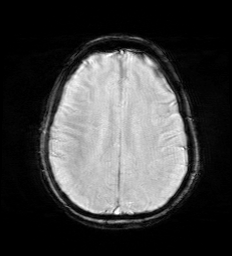
[im 60/60]
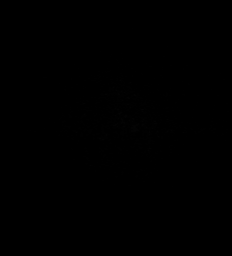

[Series 12: pha_images · axial · 3.0mm · 0.90mm/px · z∈[-109,+51]mm · 4 of 56 slices shown]
[im 1/56]
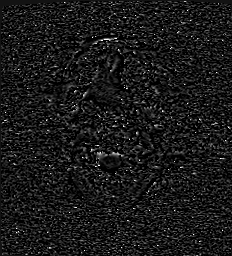
[im 19/56]
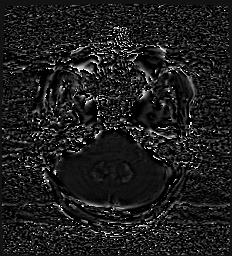
[im 37/56]
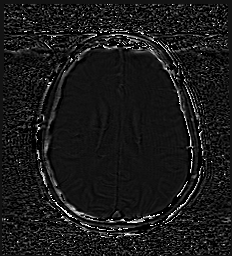
[im 56/56]
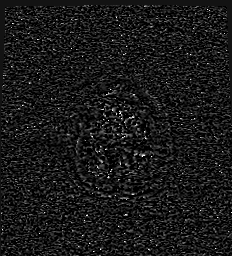

[Series 13: swi_images · axial · 3.0mm · 0.90mm/px · z∈[-109,+63]mm · 4 of 60 slices shown]
[im 1/60]
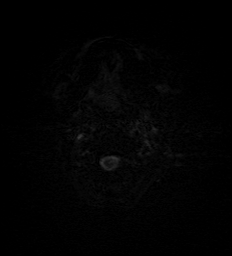
[im 20/60]
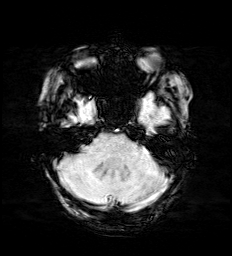
[im 40/60]
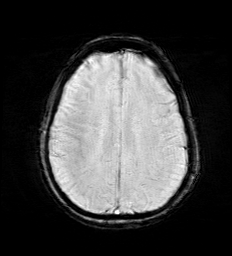
[im 60/60]
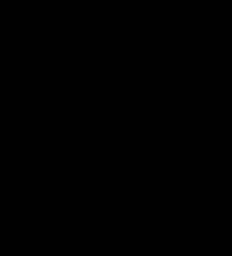

[Series 15: FLAIR · axial · 3.0mm · 0.53mm/px · z∈[-100,+57]mm · 4 of 55 slices shown (1 of 2)]
[im 1/55]
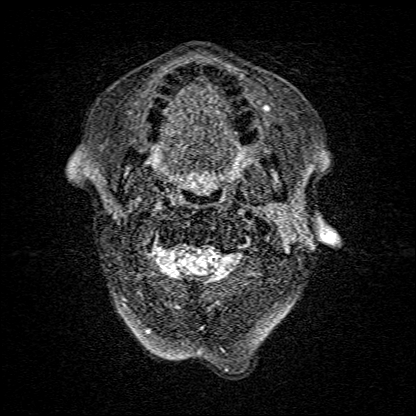
[im 19/55]
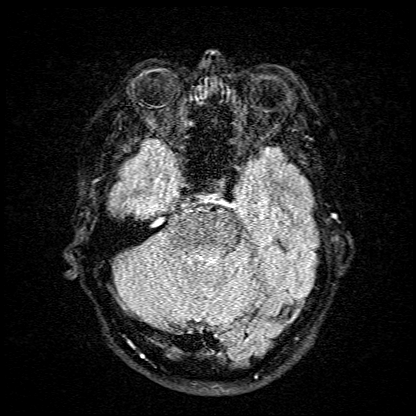
[im 37/55]
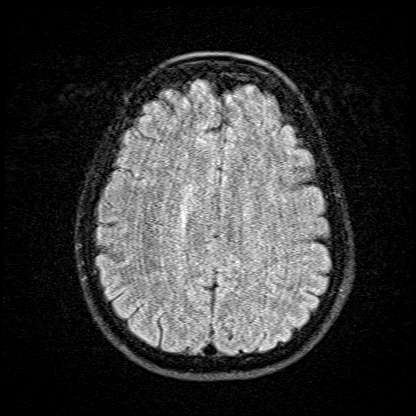
[im 55/55]
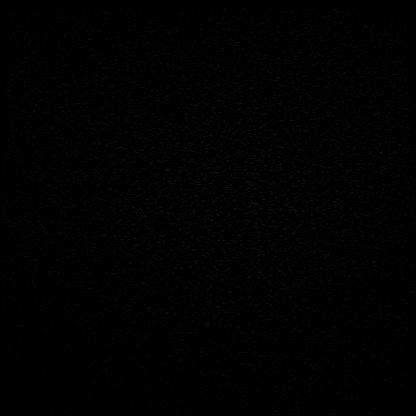

[Series 16: T1 · axial · 1.0mm · 0.98mm/px · z∈[-95,+45]mm · 8 of 144 slices shown (2 of 2)]
[im 1/144]
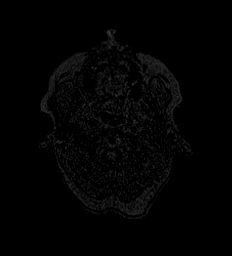
[im 16/144]
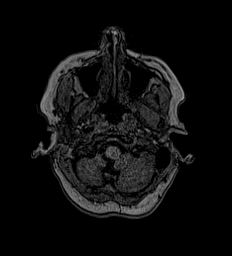
[im 48/144]
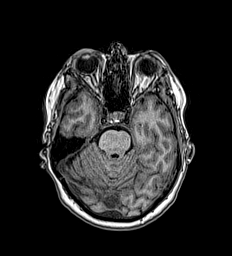
[im 64/144]
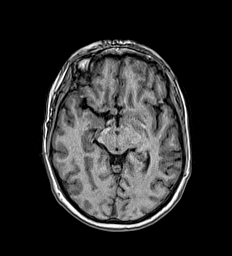
[im 80/144]
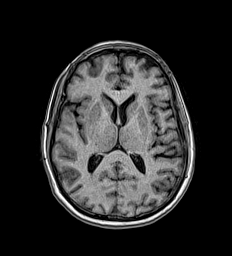
[im 96/144]
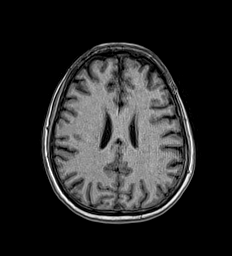
[im 128/144]
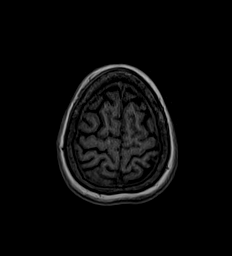
[im 144/144]
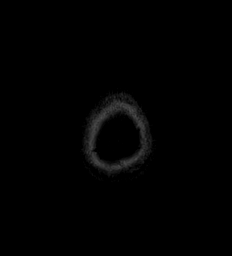

[Series 17: T2 · coronal · 5.0mm · 0.57mm/px · 2 of 29 slices shown (2 of 4)]
[im 1/29]
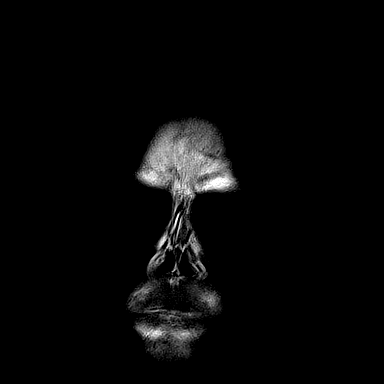
[im 29/29]
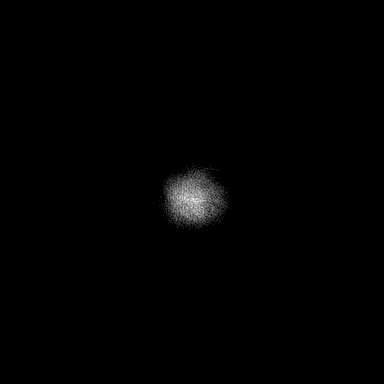

[Series 23: T2 · sagittal · 3.0mm · 0.62mm/px · 1 of 13 slices shown (3 of 4)]
[im 1/13]
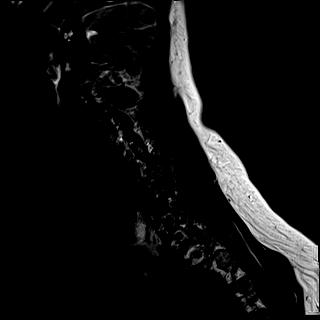

[Series 24: FLAIR · sagittal · 3.0mm · 0.78mm/px · 1 of 13 slices shown (2 of 2)]
[im 1/13]
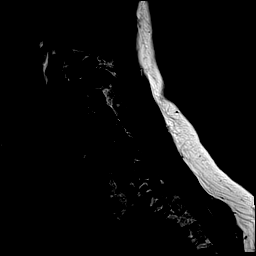

[Series 25: STIR · sagittal · 3.0mm · 0.62mm/px · 1 of 13 slices shown]
[im 1/13]
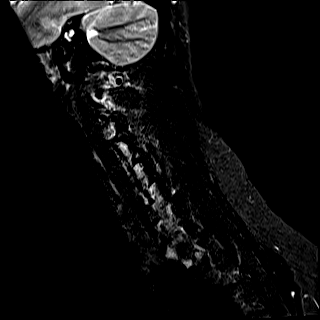

[Series 26: T2 · axial · 3.0mm · 0.70mm/px · z∈[-207,-125]mm · 2 of 26 slices shown (4 of 4)]
[im 1/26]
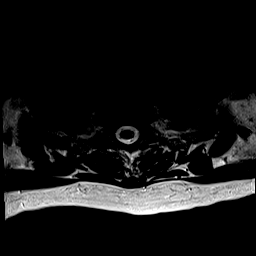
[im 26/26]
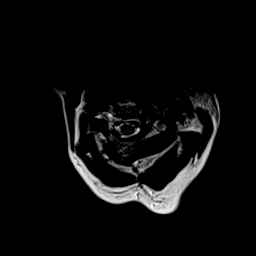

[Series 27: ax mpgr · axial · 3.0mm · 0.35mm/px · 1 of 27 slices shown]
[im 1/27]
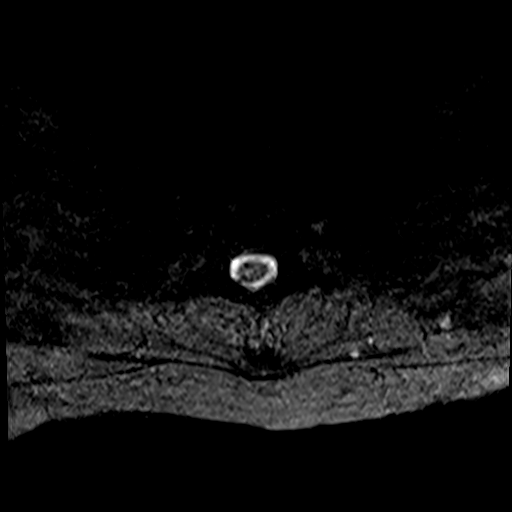

[45 of 48 positions shown; findings below may reference images not displayed]

FINDINGS: The study is partially degraded by motion.

Brain: No acute infarction, hemorrhage, hydrocephalus, extra-axial
collection or mass lesion.

The brain parenchyma has normal morphology and signal
characteristics.

Vascular: Normal flow voids.

Skull and upper cervical spine: Normal marrow signal.

Sinuses/Orbits: Mild mucosal thickening of the ethmoid cells. The
orbits are maintained.

Other: None.
IMPRESSION: 1. No acute intracranial abnormality.
2. Mild ethmoid sinus disease.

## 2019-08-13 IMAGING — MR MR CERVICAL SPINE W/O CM
10 series · 48 of 48 positions shown · non-contrast
Comparison: None.

CLINICAL DATA: Left shoulder and arm pain and swelling after
lifting object above head.

EXAM:
MRI CERVICAL SPINE WITHOUT CONTRAST
TECHNIQUE: Multiplanar, multisequence MR imaging of the cervical spine was
performed. No intravenous contrast was administered.

[Series 11: mag_images · axial · 3.0mm · 0.90mm/px · z∈[-109,+63]mm · 7 of 60 slices shown]
[im 1/60]
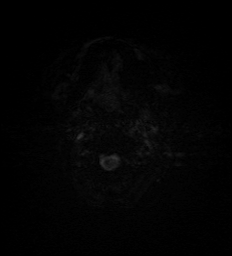
[im 10/60]
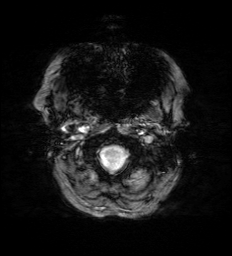
[im 20/60]
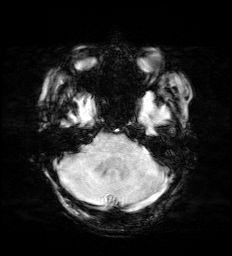
[im 30/60]
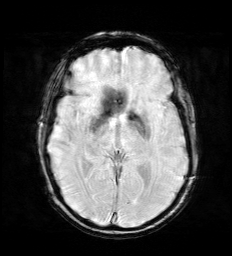
[im 40/60]
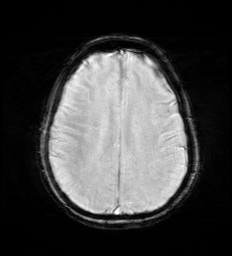
[im 50/60]
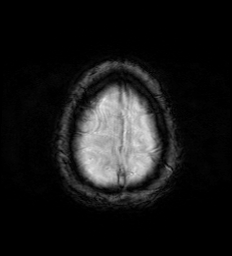
[im 60/60]
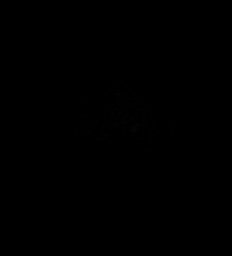

[Series 13: swi_images · axial · 3.0mm · 0.90mm/px · z∈[-109,+63]mm · 7 of 60 slices shown]
[im 1/60]
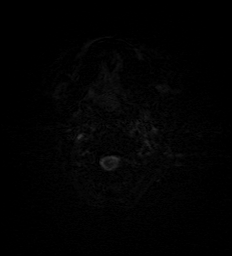
[im 10/60]
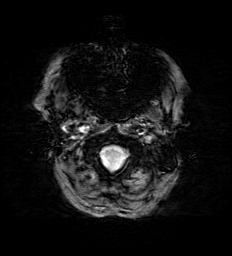
[im 20/60]
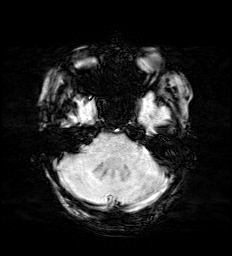
[im 30/60]
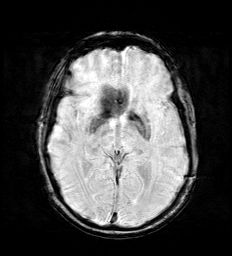
[im 40/60]
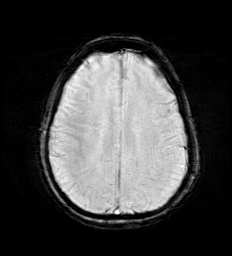
[im 50/60]
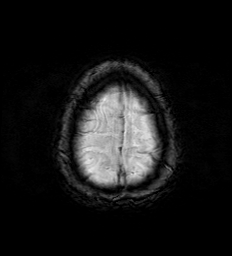
[im 60/60]
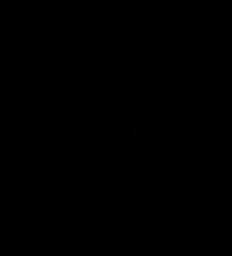

[Series 15: FLAIR · axial · 3.0mm · 0.53mm/px · z∈[-100,+57]mm · 6 of 55 slices shown (1 of 2)]
[im 1/55]
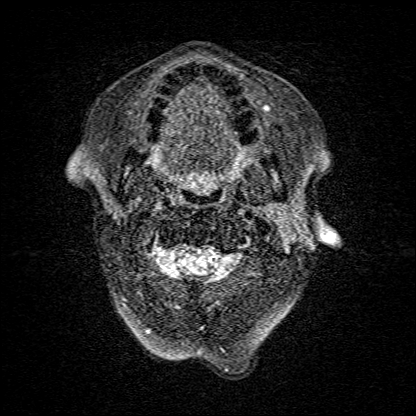
[im 11/55]
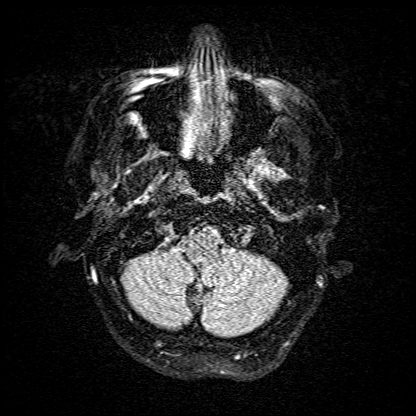
[im 22/55]
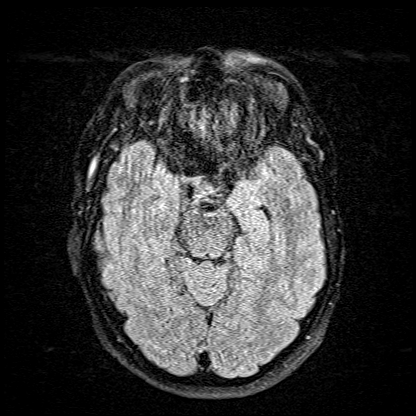
[im 33/55]
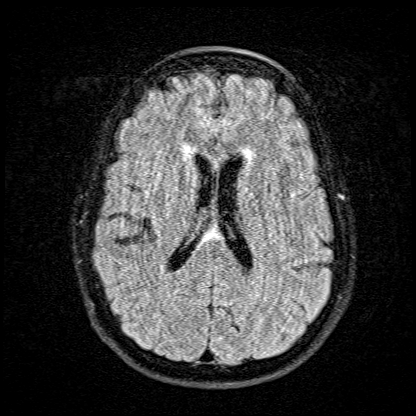
[im 44/55]
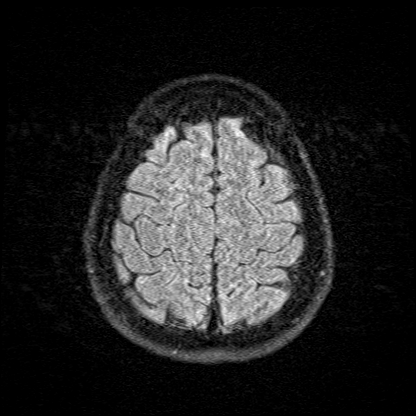
[im 55/55]
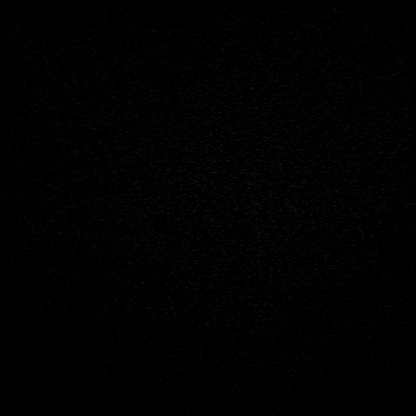

[Series 16: T1 · axial · 1.0mm · 0.98mm/px · z∈[-95,+45]mm · 16 of 144 slices shown]
[im 1/144]
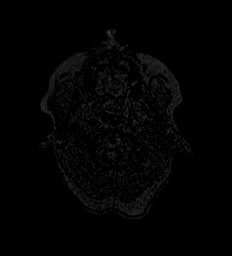
[im 10/144]
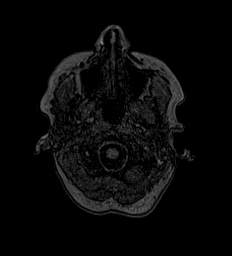
[im 20/144]
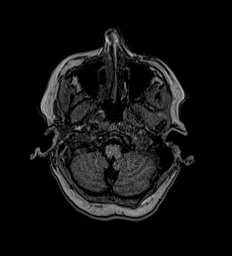
[im 29/144]
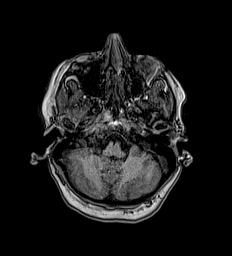
[im 39/144]
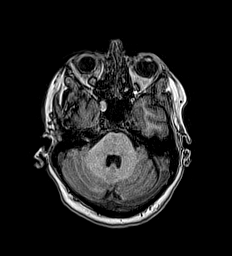
[im 48/144]
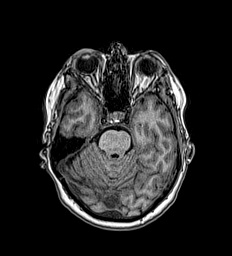
[im 58/144]
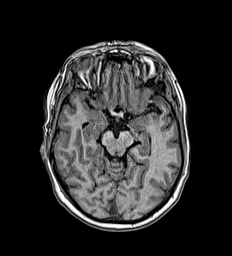
[im 67/144]
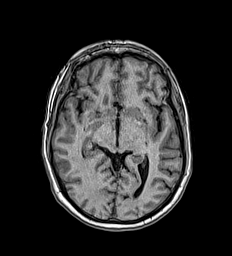
[im 77/144]
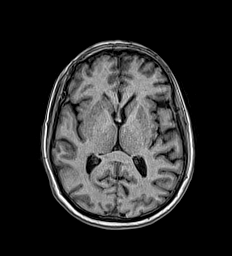
[im 86/144]
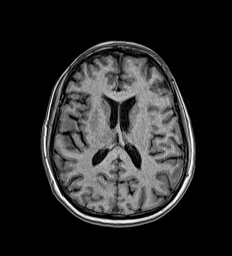
[im 96/144]
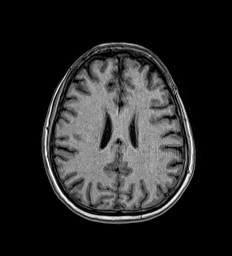
[im 105/144]
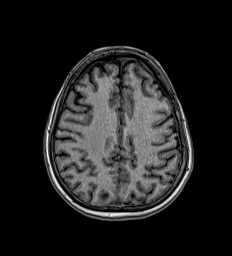
[im 115/144]
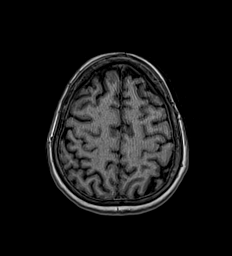
[im 124/144]
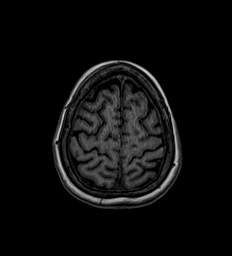
[im 134/144]
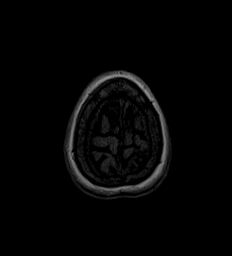
[im 144/144]
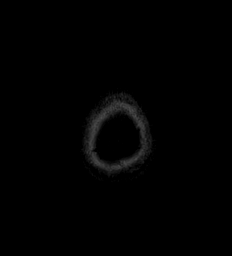

[Series 17: T2 · coronal · 5.0mm · 0.57mm/px · 3 of 29 slices shown (1 of 3)]
[im 1/29]
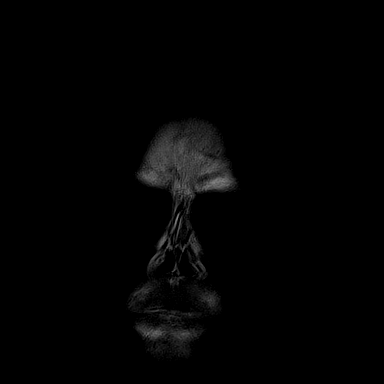
[im 15/29]
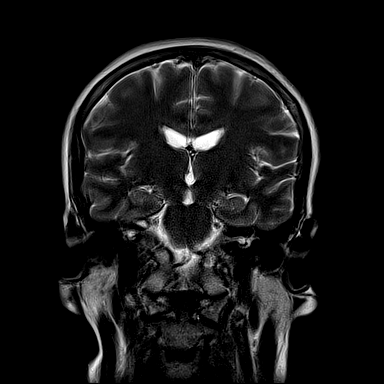
[im 29/29]
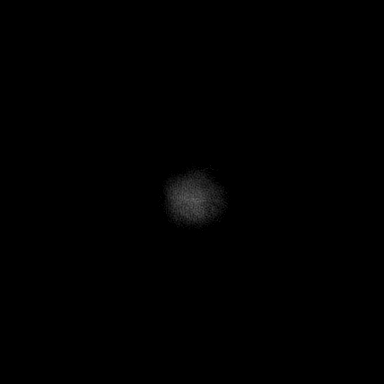

[Series 23: T2 · sagittal · 3.0mm · 0.62mm/px · 1 of 13 slices shown (2 of 3)]
[im 1/13]
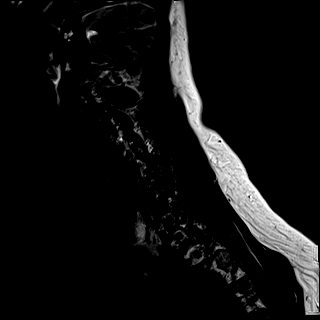

[Series 24: FLAIR · sagittal · 3.0mm · 0.78mm/px · 1 of 13 slices shown (2 of 2)]
[im 1/13]
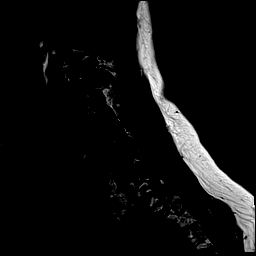

[Series 25: STIR · sagittal · 3.0mm · 0.62mm/px · 1 of 13 slices shown]
[im 1/13]
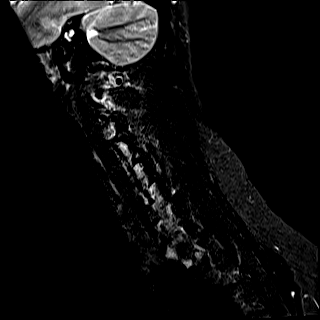

[Series 26: T2 · axial · 3.0mm · 0.70mm/px · z∈[-207,-125]mm · 3 of 26 slices shown (3 of 3)]
[im 1/26]
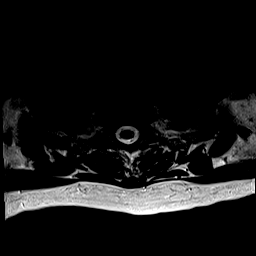
[im 13/26]
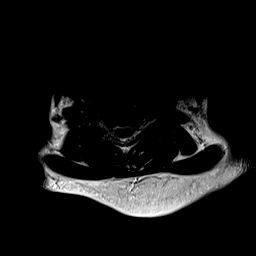
[im 26/26]
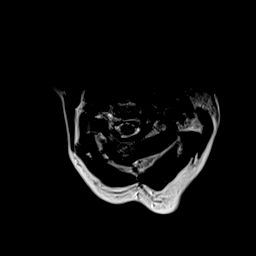

[Series 27: ax mpgr · axial · 3.0mm · 0.35mm/px · z∈[-207,-125]mm · 3 of 27 slices shown]
[im 1/27]
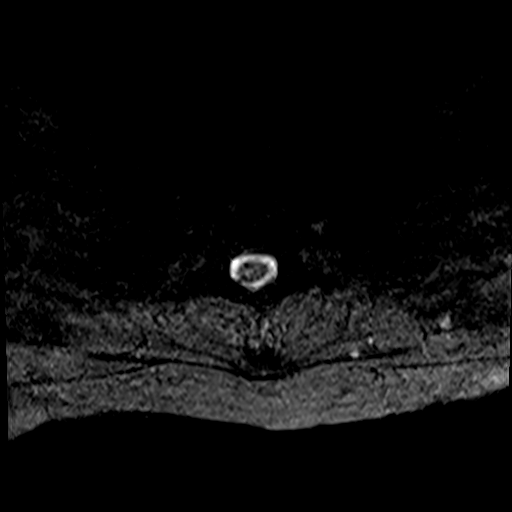
[im 14/27]
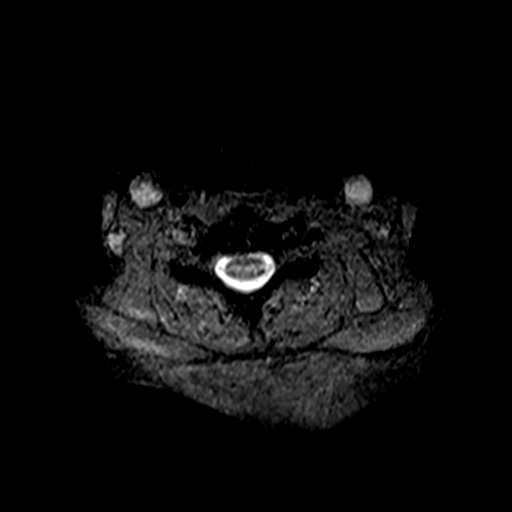
[im 27/27]
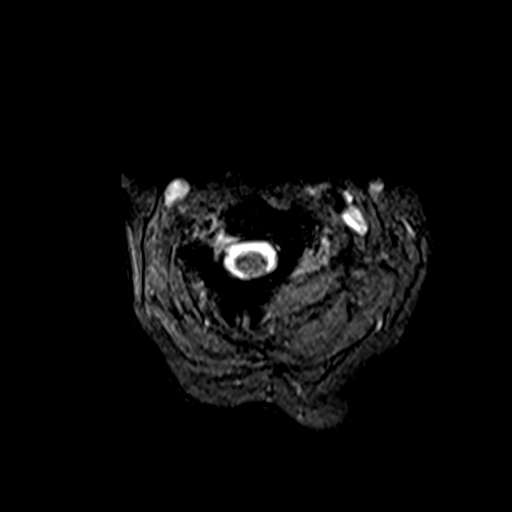

[48 of 48 positions shown; findings below may reference images not displayed]

FINDINGS: The study is partially degraded by motion.

Alignment: Mild reversal of the cervical curvature, may be related
to muscle spasm.

Vertebrae: No fracture, evidence of discitis, or bone lesion.

Cord: Normal signal and morphology.

Posterior Fossa, vertebral arteries, paraspinal tissues: Negative.

Disc levels:

C2-3: No spinal canal or neural foraminal stenosis.

C3-4: No spinal canal or neural foraminal stenosis.

C4-5: No spinal canal or neural foraminal stenosis.

C5-6: Small posterior disc protrusion causing indentation on the
anterior CSF space. No spinal canal or neural foraminal stenosis.

C6-7: No spinal canal or neural foraminal stenosis.

C7-T1: No spinal canal or neural foraminal stenosis.
IMPRESSION: 1. No acute abnormality of the cervical spine.
2. Mild degenerative disc disease C5-6.
3. No spinal canal or neural foraminal stenosis at any level.

## 2019-09-08 ENCOUNTER — Other Ambulatory Visit: Payer: Self-pay | Admitting: Neurosurgery

## 2019-09-08 DIAGNOSIS — G540 Brachial plexus disorders: Secondary | ICD-10-CM

## 2019-09-23 ENCOUNTER — Encounter: Payer: Self-pay | Admitting: Student in an Organized Health Care Education/Training Program

## 2019-09-23 ENCOUNTER — Ambulatory Visit
Payer: BC Managed Care – PPO | Attending: Student in an Organized Health Care Education/Training Program | Admitting: Student in an Organized Health Care Education/Training Program

## 2019-09-23 ENCOUNTER — Other Ambulatory Visit: Payer: Self-pay

## 2019-09-23 VITALS — BP 130/67 | HR 64 | Temp 98.2°F | Resp 16 | Ht 65.0 in | Wt 169.0 lb

## 2019-09-23 DIAGNOSIS — G8929 Other chronic pain: Secondary | ICD-10-CM | POA: Insufficient documentation

## 2019-09-23 DIAGNOSIS — G894 Chronic pain syndrome: Secondary | ICD-10-CM | POA: Diagnosis not present

## 2019-09-23 DIAGNOSIS — S143XXS Injury of brachial plexus, sequela: Secondary | ICD-10-CM | POA: Insufficient documentation

## 2019-09-23 DIAGNOSIS — G540 Brachial plexus disorders: Secondary | ICD-10-CM | POA: Diagnosis not present

## 2019-09-23 DIAGNOSIS — M25512 Pain in left shoulder: Secondary | ICD-10-CM | POA: Insufficient documentation

## 2019-09-23 DIAGNOSIS — S143XXA Injury of brachial plexus, initial encounter: Secondary | ICD-10-CM | POA: Insufficient documentation

## 2019-09-23 MED ORDER — CELECOXIB 100 MG PO CAPS: 100.0000 mg | ORAL_CAPSULE | Freq: Two times a day (BID) | ORAL | 2 refills | Status: DC | PRN

## 2019-09-23 NOTE — Progress Notes (Addendum)
Patient: Tonya Randolph  Service Category: E/M  Provider: [name], MD  DOB: Aug 25, 1955  DOS: 09/23/2019  Referring Provider: Anabel Bene, MD  MRN: [id number]  Setting: Ambulatory outpatient  PCP: [name], MD  Type: New Patient  Specialty: Interventional Pain Management    Location: Office  Delivery: Face-to-face     Primary Reason(s) for Visit: Encounter for initial evaluation of one or more chronic problems (new to examiner) potentially causing chronic pain, and posing a threat to normal musculoskeletal function. (Level of risk: High) CC: Shoulder Pain (left)  HPI  Tonya Randolph is a 64 y.o. year old, female patient, who comes today to see [place] for the first time for an initial evaluation of her chronic pain. She has Tubulovillous adenoma polyp of rectum; Injury of left brachial plexus; Neuropathy of left brachial plexus; and Chronic pain syndrome on their problem list. Today she comes in for evaluation of her Shoulder Pain (left)  Pain Assessment: Location: Left Shoulder Radiating: left upper back, entire left arm Onset: More than a month ago Duration: Chronic pain Quality: Other (Comment), Numbness, Sharp, Shooting, Tingling, Burning (weakness, swelling, excruciating) Severity: 7 /10 (subjective, self-reported pain score)  Note: Reported level is compatible with observation.  Effect on ADL:  Limits ability to do household chores Timing: Constant Modifying factors: medication (Neurontin, Tramadol) BP: 130/67   HR: 64  Onset and Duration: Gradual and Date of injury: 04/17/2019 Cause of pain: lifting heavy object Severity: Getting worse, NAS-11 at its worse: 8/10, NAS-11 at its best: 7/10 and NAS-11 now: 7/10 Timing: Night Aggravating Factors: Motion and Twisting Alleviating Factors: Medications and Resting Associated Problems: Color changes, Numbness, Swelling, Temperature changes, Tingling, Weakness, Pain that wakes patient up and Pain that does not allow  patient to sleep Quality of Pain: Aching, Agonizing, Burning, Constant, Deep, Disabling, Distressing, Feeling of constriction, Horrible, Hot, Sharp, Shooting, Stabbing, Tender, Tingling and Uncomfortable Previous Examinations or Tests: CT scan, EMG/PNCV, MRI scan, Nerve conduction test, Neurological evaluation and Neurosurgical evaluation Previous Treatments: The patient denies previous treatments  The patient comes into the clinics today for the first time for a chronic pain management evaluation.  Tonya Randolph is a very pleasant 64 year old female who presents with a chief complaint of left shoulder and left arm/ forearm pain that started in January 2 after a injury that she sustained injury moving something over her well.  She had pain the day after that has worsened since then.  Of note she did retire in February.  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  She has pain that radiates down her left arm.  Work-up has included a cervical MRI which was largely unremarkable.  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  As of yet, she has not worked with physical therapy or occupational therapy and is hoping to do so soon.  She has been seen by neurology, Dr. Melrose Nakayama and he is managing her on Neurontin 600 mg 4 times daily along with tramadol which she takes 25 to 50 mg every 3 hours to help manage her pain although she finds it ineffective.  She is scheduled to have a MRI of her left brachial plexus tomorrow to evaluate for any nerve root avulsion or tear that could perhaps warrant surgical intervention.  She is being referred here for medication management.  Historic Controlled Substance Pharmacotherapy Review  PMP and historical list of controlled substances: 08/26/2019  1   08/24/2019  Tramadol Hcl 50 MG Tablet  120.00  30 Za Pot   [id number]   Nor (4705)   0  20.00  MME  Comm Ins   [place]   Pharmacodynamics: Desired effects: Analgesia: The patient reports <50% benefit. Reported improvement in function: The patient reports medications have not provided significant benefit Clinically meaningful improvement in function (CMIF): Sustained CMIF goals met Perceived effectiveness: Described as ineffective and would like to make some changes Undesirable effects: Side-effects or Adverse reactions: None reported Historical Monitoring: The patient  reports no history of drug use. List of all UDS Test(s): No results found for: MDMA, COCAINSCRNUR, [place], [place], CANNABQUANT, [place], [place] List of other Serum/Urine Drug Screening Test(s):  No results found for: AMPHSCRSER, BARBSCRSER, BENZOSCRSER, COCAINSCRSER, COCAINSCRNUR, PCPSCRSER, PCPQUANT, THCSCRSER, THCU, CANNABQUANT, OPIATESCRSER, OXYSCRSER, PROPOXSCRSER, ETH Historical Background Evaluation: [place] PMP: PDMP not reviewed this encounter. Six (6) year initial data search conducted.             [place] Department of public safety, offender search: Editor, commissioning Information) Non-contributory Risk Assessment Profile: Aberrant behavior: None observed or detected today Risk factors for fatal opioid overdose: None identified today Fatal overdose hazard ratio (HR): Calculation deferred Non-fatal overdose hazard ratio (HR): Calculation deferred  Risk of opioid abuse or dependence: 0.7-3.0% with doses ? 36 MME/day and 6.1-26% with doses ? 120 MME/day. Substance use disorder (SUD) risk level: Low Personal History of Substance Abuse (SUD-Substance use disorder):  Alcohol: Negative  Illegal Drugs: Negative  Rx Drugs: Negative  ORT Risk Level calculation: Low Risk  Opioid Risk Tool - 09/23/19 1011      Family History of Substance Abuse   Alcohol Negative    Illegal Drugs Negative    Rx Drugs Negative      Personal History of Substance Abuse   Alcohol Negative    Illegal Drugs Negative    Rx Drugs Negative      Age   Age  between 27-45 years  No      History of Preadolescent Sexual Abuse   History of Preadolescent Sexual Abuse Negative or Female      Psychological Disease   Psychological Disease Negative    Depression Negative      Total Score   Opioid Risk Tool Scoring 0    Opioid Risk Interpretation Low Risk          ORT Scoring interpretation table:  Score <3 = Low Risk for SUD  Score between 4-7 = Moderate Risk for SUD  Score >8 = High Risk for Opioid Abuse   PHQ-2 Depression Scale:  Total score: 0  PHQ-2 Scoring interpretation table: (Score and probability of major depressive disorder)  Score 0 = No depression  Score 1 = 15.4% Probability  Score 2 = 21.1% Probability  Score 3 = 38.4% Probability  Score 4 = 45.5% Probability  Score 5 = 56.4% Probability  Score 6 = 78.6% Probability   PHQ-9 Depression Scale:  Total score: 0  PHQ-9 Scoring interpretation table:  Score 0-4 = No depression  Score 5-9 = Mild depression  Score 10-14 = Moderate depression  Score 15-19 = Moderately severe depression  Score 20-27 = Severe depression (2.4 times higher risk of SUD and 2.89 times higher risk of overuse)   Pharmacologic Plan: As per protocol, I have not taken over any controlled substance management, pending the results of ordered tests and/or consults.  Initial impression: Pending review of available data and ordered tests.  Meds   Current Outpatient Medications:    cetirizine (ZYRTEC) 10 MG tablet, Take 10 mg by mouth every morning. , Disp: , Rfl:    gabapentin (NEURONTIN) 600 MG tablet, , Disp: , Rfl:    omeprazole (PRILOSEC) 20 MG capsule, Take 20 mg by mouth daily., Disp: , Rfl:    traMADol (ULTRAM) 50 MG tablet, Take 50 mg by mouth every 4 (four) hours as needed., Disp: , Rfl:    celecoxib (CELEBREX) 100 MG capsule, Take 1 capsule (100 mg total) by mouth 2 (two) times daily as needed., Disp: 60 capsule, Rfl: 2  Imaging Review  Cervical Imaging: Cervical MR wo contrast:  Results for orders placed during the hospital encounter of 08/13/19  MR CERVICAL SPINE WO CONTRAST  Narrative CLINICAL DATA:  Left shoulder and arm pain and swelling after lifting object above head.  EXAM: MRI CERVICAL SPINE WITHOUT CONTRAST  TECHNIQUE: Multiplanar, multisequence MR imaging of the cervical spine was performed. No intravenous contrast was administered.  COMPARISON:  None.  FINDINGS: The study is partially degraded by motion.  Alignment: Mild reversal of the cervical curvature, may be related to muscle spasm.  Vertebrae: No fracture, evidence of discitis, or bone lesion.  Cord: Normal signal and morphology.  Posterior Fossa, vertebral arteries, paraspinal tissues: Negative.  Disc levels:  C2-3: No spinal canal or neural foraminal stenosis.  C3-4: No spinal canal or neural foraminal stenosis.  C4-5: No spinal canal or neural foraminal stenosis.  C5-6: Small posterior disc protrusion causing indentation on the anterior CSF space. No spinal canal or neural foraminal stenosis.  C6-7: No spinal canal or neural foraminal stenosis.  C7-T1: No spinal canal or neural foraminal stenosis.  IMPRESSION: 1. No acute abnormality of the cervical spine. 2. Mild degenerative disc disease C5-6. 3. No spinal canal or neural foraminal stenosis at any level.   Electronically Signed By: Pedro Earls M.D. On: 08/13/2019 13:45   Complexity Note: Imaging results reviewed. Results shared with Ms. Ferraz, using Layman's terms.                         ROS  Cardiovascular: Abnormal heart rhythm and Heart murmur Pulmonary or Respiratory: Coughing up mucus (Bronchitis) Neurological: Abnormal skin sensations (Peripheral Neuropathy) Psychological-Psychiatric: No reported psychological or psychiatric signs or symptoms such as difficulty sleeping, anxiety, depression, delusions or hallucinations (schizophrenial), mood swings (bipolar disorders) or suicidal  ideations or attempts Gastrointestinal: Reflux or heatburn Genitourinary: No reported renal or genitourinary signs or symptoms such as difficulty voiding or producing urine, peeing blood, non-functioning kidney, kidney stones, difficulty emptying the bladder, difficulty controlling the flow of urine, or chronic kidney disease Hematological: Weakness due to low blood hemoglobin or red blood cell count (Anemia) Endocrine: No reported endocrine signs or symptoms such as high or low blood sugar, rapid heart rate due to high thyroid levels, obesity or weight gain due to slow thyroid or thyroid disease Rheumatologic: No reported rheumatological signs and symptoms such as fatigue, joint pain, tenderness, swelling, redness, heat, stiffness, decreased range of motion, with or without associated rash Musculoskeletal: Negative for myasthenia gravis, muscular dystrophy, multiple sclerosis or malignant hyperthermia Work History: Retired  Allergies  Ms. Depierro has No Known Allergies.  Laboratory Chemistry Profile   Renal Lab Results  Component Value Date   CREATININE 0.70 07/14/2017   PROTEINUR NEGATIVE 12/20/2016     Electrolytes No results found for:  NA, K, CL, CALCIUM, MG, PHOS   Hepatic No results found for: AST, ALT, ALBUMIN, ALKPHOS, AMYLASE, LIPASE, AMMONIA   ID Lab Results  Component Value Date   STAPHAUREUS NEGATIVE 01/03/2016   MRSAPCR NEGATIVE 01/03/2016     Bone No results found for: [place], [id number], [id number], [id number], 25OHVITD1, 25OHVITD2, 25OHVITD3, TESTOFREE, TESTOSTERONE   Endocrine Lab Results  Component Value Date   GLUCOSEU NEGATIVE 12/20/2016     Neuropathy No results found for: VITAMINB12, FOLATE, HGBA1C, HIV   CNS No results found for: COLORCSF, APPEARCSF, RBCCOUNTCSF, WBCCSF, POLYSCSF, LYMPHSCSF, EOSCSF, PROTEINCSF, GLUCCSF, JCVIRUS, CSFOLI, IGGCSF, LABACHR, ACETBL, LABACHR, ACETBL   Inflammation (CRP: Acute   ESR: Chronic) No results found for: CRP,  ESRSEDRATE, LATICACIDVEN   Rheumatology No results found for: RF, ANA, LABURIC, URICUR, LYMEIGGIGMAB, LYMEABIGMQN, HLAB27   Coagulation No results found for: INR, LABPROT, APTT, PLT, DDIMER, LABHEMA, VITAMINK1, AT3   Cardiovascular No results found for: BNP, CKTOTAL, CKMB, TROPONINI, HGB, HCT, LABVMA, EPIRU, EPINEPH24HUR, NOREPRU, NOREPI24HUR, DOPARU, [id number]   Screening Lab Results  Component Value Date   STAPHAUREUS NEGATIVE 01/03/2016   MRSAPCR NEGATIVE 01/03/2016     Cancer No results found for: CEA, CA125, LABCA2   Allergens No results found for: ALMOND, APPLE, ASPARAGUS, AVOCADO, BANANA, BARLEY, BASIL, B[place]LE[place], GREENBEAN, LIMABEAN, WHITEBEAN, BEEFIGE, REDBEET, BLUEBERRY, BROCCOLI, CABBAGE, MELON, CARROT, CASEIN, CASHEWNUT, CAULIFLOWER, CELERY     Note: Lab results reviewed.   [place]  Drug: Ms. Adcox  reports no history of drug use. Alcohol:  reports current alcohol use. Tobacco:  reports that she has never smoked. She has never used smokeless tobacco. Medical:  has a past medical history of Anemia (2014), Brachial plexus injury, Cancer ([place]) (2017), Colon polyp (01/12/2016), GERD (gastroesophageal reflux disease), and Heart murmur. Family: family history includes Cancer in her mother.  Past Surgical History:  Procedure Laterality Date   basal cell removal  2017   left arm   CARPAL TUNNEL RELEASE  1985   COLON SURGERY  2017   tumor removed from colon   COLONOSCOPY N/A 01/12/2016   Tubulovillous polyp with high-grade dysplasia removed from the rectum at 12 cm.;  Surgeon: Robert Bellow, MD;  Location: [facility] ORS;  Service: General;  Laterality: N/A;   COLONOSCOPY WITH PROPOFOL N/A 12/04/2015   Procedure: COLONOSCOPY WITH PROPOFOL;  Surgeon: Lollie Sails, MD;  Location[facility]COPY;  Service: Endoscopy;  Laterality: N/A;   COLONOSCOPY WITH PROPOFOL N/A 02/11/2018   Procedure: COLONOSCOPY WITH PROPOFOL;  Surgeon: Robert Bellow, MD;  Location: [facility]  ENDOSCOPY;  Service: Endoscopy;  Laterality: N/A;   TONSILLECTOMY  1973   TUBAL LIGATION  1986   Active Ambulatory Problems    Diagnosis Date Noted   Tubulovillous adenoma polyp of rectum 01/21/2017   Injury of left brachial plexus 09/23/2019   Neuropathy of left brachial plexus 09/23/2019   Chronic pain syndrome 09/23/2019   Resolved Ambulatory Problems    Diagnosis Date Noted   History of colonic polyps 12/04/2015   Past Medical History:  Diagnosis Date   Anemia 2014   Brachial plexus injury    Cancer ([place]) 2017   Colon polyp 01/12/2016   GERD (gastroesophageal reflux disease)    Heart murmur    Constitutional Exam  General appearance: alert, cooperative and in mild distress Vitals:   09/23/19 1003  BP: 130/67  Pulse: 64  Resp: 16  Temp: 98.2 F (36.8 C)  TempSrc: Temporal  SpO2: 97%  Weight: 169 lb (76.7 kg)  Height: 5' 5"  (  1.651 m)   BMI Assessment: Estimated body mass index is 28.12 kg/m as calculated from the following:   Height as of this encounter: 5' 5"  (1.651 m).   Weight as of this encounter: 169 lb (76.7 kg).  BMI interpretation table: BMI level Category Range association with higher incidence of chronic pain  <18 kg/m2 Underweight   18.5-24.9 kg/m2 Ideal body weight   25-29.9 kg/m2 Overweight Increased incidence by 20%  30-34.9 kg/m2 Obese (Class I) Increased incidence by 68%  35-39.9 kg/m2 Severe obesity (Class II) Increased incidence by 136%  >40 kg/m2 Extreme obesity (Class III) Increased incidence by 254%   Patient's current BMI Ideal Body weight  Body mass index is 28.12 kg/m. Ideal body weight: 57 kg (125 lb 10.6 oz) Adjusted ideal body weight: 64.9 kg (143 lb)   BMI Readings from Last 4 Encounters:  09/23/19 28.12 kg/m  02/11/18 27.79 kg/m  01/27/18 27.53 kg/m  01/21/17 28.29 kg/m   Wt Readings from Last 4 Encounters:  09/23/19 169 lb (76.7 kg)  02/11/18 167 lb (75.8 kg)  01/27/18 168 lb (76.2 kg)  01/21/17 170 lb  (77.1 kg)    Psych/Mental status: Alert, oriented x 3 (person, place, & time)       Eyes: PERLA Respiratory: No evidence of acute respiratory distress  Cervical Spine Exam  Skin & Axial Inspection: No masses, redness, edema, swelling, or associated skin lesions Alignment: Symmetrical Functional ROM: Unrestricted ROM      Stability: No instability detected Muscle Tone/Strength: Functionally intact. No obvious neuro-muscular anomalies detected. Sensory (Neurological): Unimpaired Palpation: No palpable anomalies              Upper Extremity (UE) Exam    Side: Right upper extremity  Side: Left upper extremity  Skin & Extremity Inspection: Skin color, temperature, and hair growth are WNL. No peripheral edema or cyanosis. No masses, redness, swelling, asymmetry, or associated skin lesions. No contractures.  Skin & Extremity Inspection: Some redness observed, + color changes, mild edema, allodynia  Functional ROM: Unrestricted ROM          Functional ROM: Pain restricted ROM for all joints of upper extremity  Muscle Tone/Strength: Functionally intact. No obvious neuro-muscular anomalies detected.  Muscle Tone/Strength: Movement possible against gravity, but not against resistance (3/5)  Sensory (Neurological): Unimpaired          Sensory (Neurological): Neurogenic pain pattern          Palpation: No palpable anomalies              Palpation: Complains of area being tender to palpation              Provocative Test(s):  Phalen's test: deferred Tinel's test: deferred Apley's scratch test (touch opposite shoulder):  Action 1 (Across chest): deferred Action 2 (Overhead): deferred Action 3 (LB reach): deferred   Provocative Test(s):  Phalen's test: deferred Tinel's test: deferred Apley's scratch test (touch opposite shoulder):  Action 1 (Across chest): Decreased ROM Action 2 (Overhead): Decreased ROM Action 3 (LB reach): Decreased ROM    Thoracic Spine Area Exam  Skin & Axial Inspection:  No masses, redness, or swelling Alignment: Symmetrical Functional ROM: Unrestricted ROM Stability: No instability detected Muscle Tone/Strength: Functionally intact. No obvious neuro-muscular anomalies detected. Sensory (Neurological): Unimpaired Muscle strength & Tone: No palpable anomalies  Lumbar Exam  Skin & Axial Inspection: No masses, redness, or swelling Alignment: Symmetrical Functional ROM: Unrestricted ROM       Stability: No instability detected Muscle Tone/Strength:  Functionally intact. No obvious neuro-muscular anomalies detected. Sensory (Neurological): Unimpaired  Gait & Posture Assessment  Ambulation: Unassisted Gait: Relatively normal for age and body habitus Posture: WNL   Lower Extremity Exam    Side: Right lower extremity  Side: Left lower extremity  Stability: No instability observed          Stability: No instability observed          Skin & Extremity Inspection: Skin color, temperature, and hair growth are WNL. No peripheral edema or cyanosis. No masses, redness, swelling, asymmetry, or associated skin lesions. No contractures.  Skin & Extremity Inspection: Skin color, temperature, and hair growth are WNL. No peripheral edema or cyanosis. No masses, redness, swelling, asymmetry, or associated skin lesions. No contractures.  Functional ROM: Unrestricted ROM                  Functional ROM: Unrestricted ROM                  Muscle Tone/Strength: Functionally intact. No obvious neuro-muscular anomalies detected.  Muscle Tone/Strength: Functionally intact. No obvious neuro-muscular anomalies detected.  Sensory (Neurological): Unimpaired        Sensory (Neurological): Unimpaired        DTR: Patellar: deferred today Achilles: deferred today Plantar: deferred today  DTR: Patellar: deferred today Achilles: deferred today Plantar: deferred today  Palpation: No palpable anomalies  Palpation: No palpable anomalies   Assessment  Primary Diagnosis & Pertinent Problem  List: The primary encounter diagnosis was Injury of left brachial plexus, sequela. Diagnoses of Neuropathy of left brachial plexus, Chronic left shoulder pain, and Chronic pain syndrome were also pertinent to this visit.  Visit Diagnosis (New problems to examiner): 1. Injury of left brachial plexus, sequela   2. Neuropathy of left brachial plexus   3. Chronic left shoulder pain   4. Chronic pain syndrome    Plan of Care (Initial workup plan)  Note: Ms. Babinski was reminded that as per protocol, today's visit has been an evaluation only. We have not taken over the patient's controlled substance management.   Tonya Randolph is a very nice 64 year old female with severe left shoulder and arm pain related to left brachial plexopathy confirmed on EMG/nerve conduction velocity study.  She is scheduled to have her brachial plexus MRI tomorrow.  This should help further delineate treatment plan which may include surgery if there is a significant nerve root lesion.  Patient is being referred here from Dr. Melrose Nakayama, neurology for medication management.  Continue gabapentin as prescribed.  No side effects at 600 mg 4 times daily.  We will start Celebrex as below.  Continue tramadol for time being.  Will complete urine toxicology screen and can escalate to an alternative opioid analgesics and tramadol is not effective.  We will also review patient's brachial plexus MRI when she follows up.    Lab Orders     Compliance Drug Analysis, Ur  Pharmacotherapy (current): Medications ordered:  Meds ordered this encounter  Medications   celecoxib (CELEBREX) 100 MG capsule    Sig: Take 1 capsule (100 mg total) by mouth 2 (two) times daily as needed.    Dispense:  60 capsule    Refill:  2   Medications administered during this visit: Delsy Etzkorn. Schauf "PAT" had no medications administered during this visit.   Pharmacological management options:  Opioid Analgesics: The patient was informed that there is no  guarantee that she would be a candidate for opioid analgesics. The decision will be  made following CDC guidelines. This decision will be based on the results of diagnostic studies, as well as Ms. Chauca's risk profile.   Membrane stabilizer: Continue gabapentin 600 g 4 times daily  Muscle relaxant: To be determined at a later time  NSAID: Start Celebrex 100 mg twice daily as needed  Other analgesic(s): To be determined at a later time   Interventional management options: Ms. Potteiger was informed that there is no guarantee that she would be a candidate for interventional therapies. The decision will be based on the results of diagnostic studies, as well as Ms. Bagby's risk profile.  Procedure(s) under consideration:  Brachial plexus MRI   Provider-requested follow-up: Return in about 3 weeks (around 10/14/2019) for Medication Management, in person.  Future Appointments  Date Time Provider [place]  09/24/2019  9:00 AM [facility]  10/14/2019 11:30 AM [name], MD [facility] None    Note by: [name], MD Date: 09/23/2019; Time: 10:51 AM

## 2019-09-23 NOTE — Patient Instructions (Signed)
A prescription for Celebrex has been sent to your pharmacy.

## 2019-09-23 NOTE — Progress Notes (Signed)
Safety precautions to be maintained throughout the outpatient stay will include: orient to surroundings, keep bed in low position, maintain call bell within reach at all times, provide assistance with transfer out of bed and ambulation.  

## 2019-09-24 ENCOUNTER — Ambulatory Visit
Admission: RE | Admit: 2019-09-24 | Discharge: 2019-09-24 | Disposition: A | Discharge status: Home / Self Care | Payer: BC Managed Care – PPO | Source: Ambulatory Visit | Attending: Neurosurgery | Admitting: Neurosurgery

## 2019-09-24 DIAGNOSIS — G540 Brachial plexus disorders: Secondary | ICD-10-CM | POA: Diagnosis present

## 2019-09-24 IMAGING — MR MR CHEST MEDIASTINUM WO/W CM
10 series · 16 of 16 positions shown · IV contrast (gadavist)
Comparison: MRI cervical spine dated [DATE]. CT chest dated
[DATE].

CLINICAL DATA: Left arm pain, weakness, and numbness since lifting
injury in [REDACTED].

EXAM:
MRI BRACHIAL PLEXUS WITHOUT AND WITH CONTRAST
TECHNIQUE: Multiplanar, multiecho pulse sequences of the neck and surrounding
structures were obtained without and with intravenous contrast. The
field of view was focused on the left brachial plexus from the
neural foramina to the axilla.
CONTRAST:  7mL GADAVIST GADOBUTROL 1 MMOL/ML IV SOLN

[Series 2: T2 fat-sat · axial · 3.0mm · 1.33mm/px · 1 of 40 slices shown (1 of 2)]
[im 1/40]
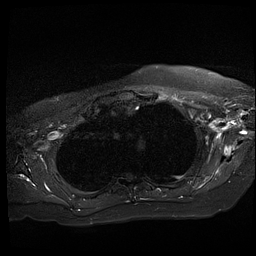

[Series 3: T1 · axial · 3.0mm · 1.06mm/px · 1 of 40 slices shown (1 of 3)]
[im 1/40]
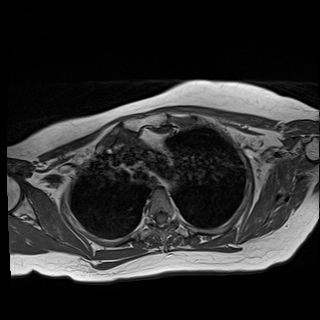

[Series 4: T1 fat-sat · axial · non-contrast · 3.0mm · 1.06mm/px · 1 of 40 slices shown]
[im 1/40]
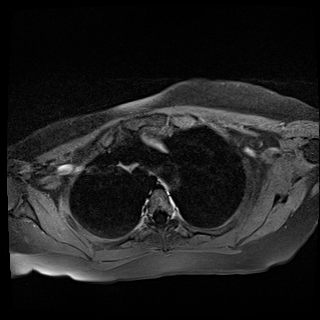

[Series 5: T1 · coronal · 4.0mm · 0.75mm/px · 2 of 48 slices shown (2 of 3)]
[im 1/48]
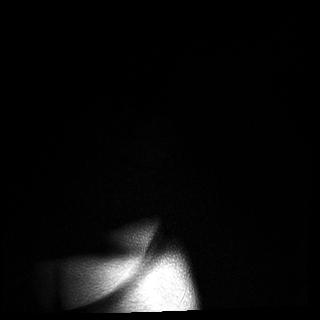
[im 48/48]
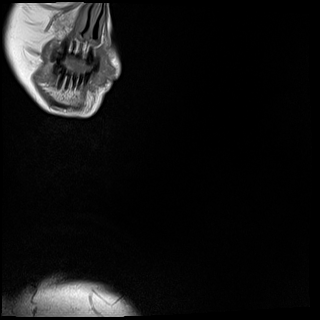

[Series 6: STIR · coronal · 4.0mm · 0.94mm/px · 2 of 48 slices shown]
[im 1/48]
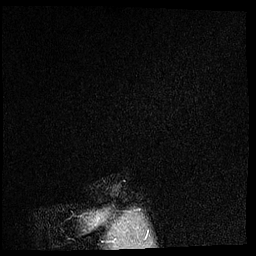
[im 48/48]
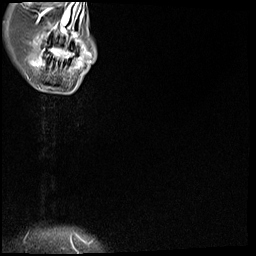

[Series 7: T1 · sagittal · 4.0mm · 0.75mm/px · 2 of 48 slices shown (3 of 3)]
[im 1/48]
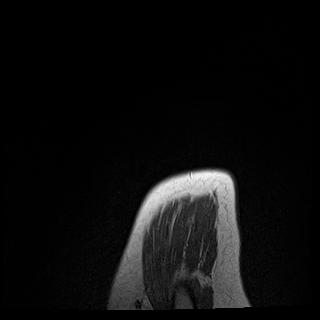
[im 48/48]
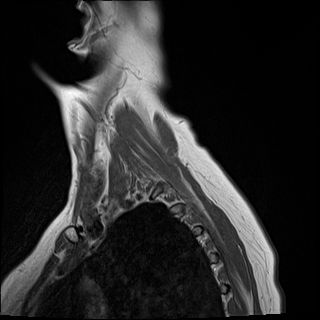

[Series 8: T2 fat-sat · sagittal · 4.0mm · 0.94mm/px · 2 of 48 slices shown (2 of 2)]
[im 1/48]
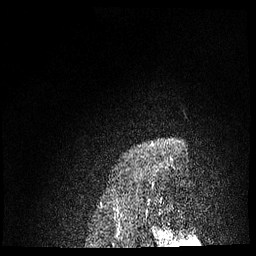
[im 48/48]
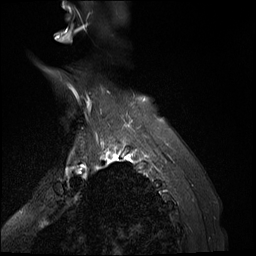

[Series 9: T1 fat-sat post-contrast · axial · 3.0mm · 1.06mm/px · 1 of 40 slices shown (1 of 3)]
[im 1/40]
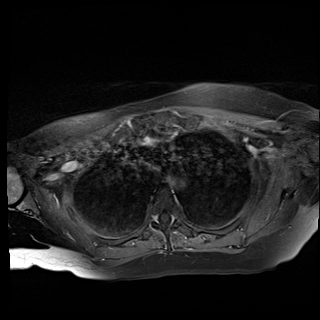

[Series 10: T1 fat-sat post-contrast · sagittal · 4.0mm · 0.75mm/px · 2 of 48 slices shown (2 of 3)]
[im 1/48]
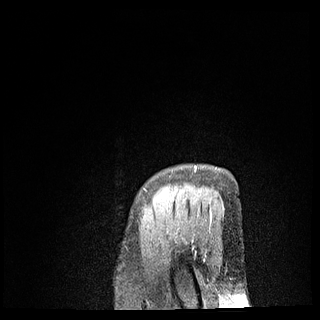
[im 48/48]
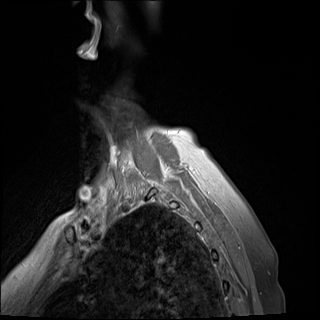

[Series 11: T1 fat-sat post-contrast · coronal · 4.0mm · 0.75mm/px · 2 of 48 slices shown (3 of 3)]
[im 1/48]
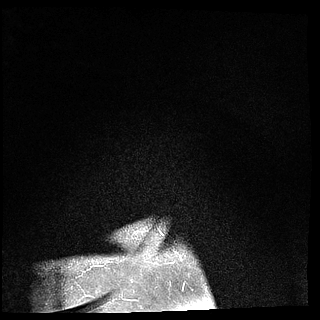
[im 48/48]
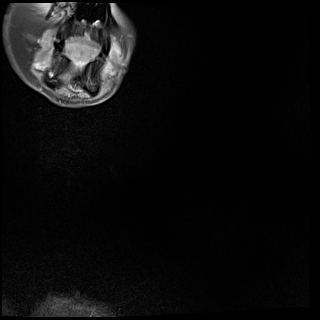

[16 of 16 positions shown; findings below may reference images not displayed]

FINDINGS: Spinal cord

Normal caliber and signal of visualized spinal cord.

Brachial plexus

Roots: Normal.

Trunks: Normal.

Divisions: Normal.

Cords: Normal.

Branches: Normal.

Muscles and tendons

Small focal full-thickness, partial width tear of left mid
supraspinatus tendon (series 6, image 26; series 8, image 40). Edema
in the left infraspinatus muscle (series 8, image 24). No muscle
atrophy.

Bones

Cervical spine: Unchanged small posterior disc protrusion at C5-C6.

C7 transverse processes: Normal.

Marrow signal: Normal.

Other findings

Small left glenohumeral effusion with thin synovial enhancement.
Small amount of fluid in the left subacromial/subdeltoid bursa. Mild
left acromioclavicular osteoarthritis.
IMPRESSION: 1. Normal left brachial plexus.
2. Small focal full-thickness, partial width tear of the left mid
supraspinatus tendon.
3. Left infraspinatus muscle strain.
4. Small left glenohumeral effusion. Mild left acromioclavicular
osteoarthritis.

## 2019-09-24 MED ORDER — GADOBUTROL 1 MMOL/ML IV SOLN
7.0000 mL | Freq: Once | INTRAVENOUS | Status: AC | PRN
  Administered 2019-09-24: 7 mL via INTRAVENOUS

## 2019-09-27 ENCOUNTER — Encounter: Payer: Self-pay | Admitting: *Deleted

## 2019-09-30 LAB — COMPLIANCE DRUG ANALYSIS, UR

## 2019-10-14 ENCOUNTER — Other Ambulatory Visit: Payer: Self-pay

## 2019-10-14 ENCOUNTER — Ambulatory Visit
Payer: BC Managed Care – PPO | Attending: Student in an Organized Health Care Education/Training Program | Admitting: Student in an Organized Health Care Education/Training Program

## 2019-10-14 ENCOUNTER — Encounter: Payer: Self-pay | Admitting: Student in an Organized Health Care Education/Training Program

## 2019-10-14 VITALS — BP 138/75 | HR 65 | Temp 98.0°F | Resp 16 | Ht 65.0 in | Wt 166.0 lb

## 2019-10-14 DIAGNOSIS — S143XXS Injury of brachial plexus, sequela: Secondary | ICD-10-CM | POA: Diagnosis not present

## 2019-10-14 DIAGNOSIS — G894 Chronic pain syndrome: Secondary | ICD-10-CM | POA: Insufficient documentation

## 2019-10-14 DIAGNOSIS — G540 Brachial plexus disorders: Secondary | ICD-10-CM | POA: Insufficient documentation

## 2019-10-14 DIAGNOSIS — M67912 Unspecified disorder of synovium and tendon, left shoulder: Secondary | ICD-10-CM | POA: Insufficient documentation

## 2019-10-14 DIAGNOSIS — M25512 Pain in left shoulder: Secondary | ICD-10-CM

## 2019-10-14 DIAGNOSIS — G8929 Other chronic pain: Secondary | ICD-10-CM | POA: Diagnosis present

## 2019-10-14 MED ORDER — TRAMADOL HCL 50 MG PO TABS: 100.0000 mg | ORAL_TABLET | Freq: Three times a day (TID) | ORAL | 2 refills | Status: AC | PRN

## 2019-10-14 MED ORDER — GABAPENTIN 600 MG PO TABS: 600.0000 mg | ORAL_TABLET | Freq: Four times a day (QID) | ORAL | 2 refills | Status: DC | PRN

## 2019-10-14 MED ORDER — HYDROCODONE-ACETAMINOPHEN 5-325 MG PO TABS: 1.0000 | ORAL_TABLET | Freq: Two times a day (BID) | ORAL | 0 refills | Status: AC | PRN

## 2019-10-14 NOTE — Patient Instructions (Addendum)
Prescriptions for Gabapentin, Tramadol, and Hydrocodone were sent to your pharmacy.

## 2019-10-14 NOTE — Progress Notes (Signed)
Nursing Pain Medication Assessment:  Safety precautions to be maintained throughout the outpatient stay will include: orient to surroundings, keep bed in low position, maintain call bell within reach at all times, provide assistance with transfer out of bed and ambulation.  Medication Inspection Compliance: Pill count conducted under aseptic conditions, in front of the patient. Neither the pills nor the bottle was removed from the patient's sight at any time. Once count was completed pills were immediately returned to the patient in their original bottle.  Medication: Tramadol (Ultram) Pill/Patch Count: 54.5 of 180 pills remain Pill/Patch Appearance: Markings consistent with prescribed medication Bottle Appearance: Standard pharmacy container. Clearly labeled. Filled Date:09/22/2019 Last Medication intake:  Today

## 2019-10-14 NOTE — Progress Notes (Signed)
PROVIDER NOTE: Information contained herein reflects review and annotations entered in association with encounter. Interpretation of such information and data should be left to medically-trained personnel. Information provided to patient can be located elsewhere in the medical record under "Patient Instructions". Document created using STT-dictation technology, any transcriptional errors that may result from process are unintentional.    Patient: Tonya Randolph  Service Category: E/M  Provider: [name], MD  DOB: 08-27-1955  DOS: 10/14/2019  Specialty: Interventional Pain Management  MRN: [id number]  Setting: Ambulatory outpatient  PCP: [name], MD  Type: Established Patient    Referring Provider: [name], MD  Location: Office  Delivery: Face-to-face     HPI  Reason for encounter: Ms. Tonya Randolph, a 64 y.o. year old female, is here today for evaluation and management of her Dysfunction of left rotator cuff [M67.912]. Tonya Randolph primary complain today is Shoulder Pain (left) and Hand Pain (left) Last encounter: Practice (09/23/2019). My last encounter with her was on 09/23/2019. Pertinent problems: Tonya Randolph has Injury of left brachial plexus; Neuropathy of left brachial plexus; and Chronic left shoulder pain on their pertinent problem list. Pain Assessment: Severity of Chronic pain is reported as a 7 /10. Location: Shoulder Left/numbness and hypersensitivity to left arm. Onset: More than a month ago. Quality: Burning, Sharp, Throbbing, Shooting. Timing: Constant. Modifying factor(s): medications. Vitals:  height is 5' 5"  (1.651 m) and weight is 166 lb (75.3 kg). Her temporal temperature is 98 F (36.7 C). Her blood pressure is 138/75 and her pulse is 65. Her respiration is 16 and oxygen saturation is 98%.   Patient presents today for her second patient visit.  No change in her medical history since her first visit with me.  She states that she is out of  gabapentin.  She did see physician assistant, Clent Ridges with orthopedics on 10/08/2019.  There she had a left subacromial bursa injection for her left shoulder which she states was not helpful.  She states that her pain has been worse for the last week secondary to the injection.  She is requesting a refill of her tramadol and gabapentin.  For severe breakthrough pain, we discussed the addition of hydrocodone as needed.  I also had extensive discussion with the patient regarding left axillary nerve peripheral nerve stimulation for left shoulder pain and provided her with resources which she will think more about.   Pharmacotherapy Assessment    Monitoring: [place] PMP: PDMP reviewed during this encounter.       Pharmacotherapy: No side-effects or adverse reactions reported. Compliance: No problems identified. Effectiveness: Clinically acceptable.  UDS:  Summary  Date Value Ref Range Status  09/22/2019 Note  Final    Comment:    ==================================================================== Compliance Drug Analysis, Ur ==================================================================== Test                             Result       Flag       Units  Drug Present and Declared for Prescription Verification   Tramadol                       >20000       EXPECTED   ng/mg creat   O-Desmethyltramadol            >20000       EXPECTED   ng/mg creat   N-Desmethyltramadol  10376        EXPECTED   ng/mg creat    Source of tramadol is a prescription medication. O-desmethyltramadol    and N-desmethyltramadol are expected metabolites of tramadol.    Gabapentin                     PRESENT      EXPECTED  Drug Present not Declared for Prescription Verification   Ibuprofen                      PRESENT      UNEXPECTED ==================================================================== Test                      Result    Flag   Units      Ref Range   Creatinine              25                mg/dL      >=20 ==================================================================== Declared Medications:  The flagging and interpretation on this report are based on the  following declared medications.  Unexpected results may arise from  inaccuracies in the declared medications.   **Note: The testing scope of this panel includes these medications:   Gabapentin (Neurontin)  Tramadol (Ultram)   **Note: The testing scope of this panel does not include the  following reported medications:   Celecoxib (Celebrex)  Cetirizine (Zyrtec)  Omeprazole (Prilosec) ==================================================================== For clinical consultation, please call [phone]. ====================================================================       ROS  Constitutional: Denies any fever or chills Gastrointestinal: No reported hemesis, hematochezia, vomiting, or acute GI distress Musculoskeletal: Denies any acute onset joint swelling, redness, loss of ROM, or weakness Neurological: No reported episodes of acute onset apraxia, aphasia, dysarthria, agnosia, amnesia, paralysis, loss of coordination, or loss of consciousness  Medication Review  HYDROcodone-acetaminophen, celecoxib, cetirizine, gabapentin, omeprazole, and traMADol  History Review  Allergy: Tonya Randolph has No Known Allergies. Drug: Tonya Randolph  reports no history of drug use. Alcohol:  reports current alcohol use. Tobacco:  reports that she has never smoked. She has never used smokeless tobacco. Social: Tonya Randolph  reports that she has never smoked. She has never used smokeless tobacco. She reports current alcohol use. She reports that she does not use drugs. Medical:  has a past medical history of Anemia (2014), Brachial plexus injury, Cancer ([place]) (2017), Colon polyp (01/12/2016), GERD (gastroesophageal reflux disease), and Heart murmur. Surgical: Tonya Randolph  has a past surgical history that includes  Carpal tunnel release (1985); Tonsillectomy (1973); Colonoscopy with propofol (N/A, 12/04/2015); basal cell removal (2017); Tubal ligation (1986); Colonoscopy (N/A, 01/12/2016); Colon surgery (2017); and Colonoscopy with propofol (N/A, 02/11/2018). Family: family history includes Cancer in her mother.  Laboratory Chemistry Profile   Renal Lab Results  Component Value Date   CREATININE 0.70 07/14/2017     Hepatic No results found for: AST, ALT, ALBUMIN, ALKPHOS, HCVAB, AMYLASE, LIPASE, AMMONIA   Electrolytes No results found for: NA, K, CL, CALCIUM, MG, PHOS   Bone No results found for: VD25OH, [id number], RS8546EV0, JJ0093GH8, 25OHVITD1, 25OHVITD2, 25OHVITD3, TESTOFREE, TESTOSTERONE   Inflammation (CRP: Acute Phase) (ESR: Chronic Phase) No results found for: CRP, ESRSEDRATE, LATICACIDVEN     Note: Above Lab results reviewed.  Recent Imaging Review  MR CHEST W WO CONTRAST CLINICAL DATA:  Left arm pain, weakness, and numbness since lifting injury in January.  EXAM: MRI BRACHIAL PLEXUS WITHOUT  AND WITH CONTRAST  TECHNIQUE: Multiplanar, multiecho pulse sequences of the neck and surrounding structures were obtained without and with intravenous contrast. The field of view was focused on the left brachial plexus from the neural foramina to the axilla.  CONTRAST:  91m GADAVIST GADOBUTROL 1 MMOL/ML IV SOLN  COMPARISON:  MRI cervical spine dated August 13, 2019. CT chest dated July 14, 2017.  FINDINGS: Spinal cord  Normal caliber and signal of visualized spinal cord.  Brachial plexus  Roots: Normal.  Trunks: Normal.  Divisions: Normal.  Cords: Normal.  Branches: Normal.  Muscles and tendons  Small focal full-thickness, partial width tear of left mid supraspinatus tendon (series 6, image 26; series 8, image 40). Edema in the left infraspinatus muscle (series 8, image 24). No muscle atrophy.  Bones  Cervical spine: Unchanged small posterior disc protrusion at  C5-C6.  C7 transverse processes: Normal.  Marrow signal: Normal.  Other findings  Small left glenohumeral effusion with thin synovial enhancement. Small amount of fluid in the left subacromial/subdeltoid bursa. Mild left acromioclavicular osteoarthritis.  IMPRESSION: 1. Normal left brachial plexus. 2. Small focal full-thickness, partial width tear of the left mid supraspinatus tendon. 3. Left infraspinatus muscle strain. 4. Small left glenohumeral effusion. Mild left acromioclavicular osteoarthritis.  Electronically Signed   By: WTitus DubinM.D.   On: 09/25/2019 16:58 Note: Reviewed        Physical Exam  General appearance: Well nourished, well developed, and well hydrated. In no apparent acute distress Mental status: Alert, oriented x 3 (person, place, & time)       Respiratory: No evidence of acute respiratory distress Eyes: PERLA Vitals: BP 138/75   Pulse 65   Temp 98 F (36.7 C) (Temporal)   Resp 16   Ht 5' 5"  (1.651 m)   Wt 166 lb (75.3 kg)   LMP  (LMP Unknown)   SpO2 98%   BMI 27.62 kg/m  BMI: Estimated body mass index is 27.62 kg/m as calculated from the following:   Height as of this encounter: 5' 5"  (1.651 m).   Weight as of this encounter: 166 lb (75.3 kg). Ideal: Ideal body weight: 57 kg (125 lb 10.6 oz) Adjusted ideal body weight: 64.3 kg (141 lb 12.8 oz)  Cervical Spine Exam  Skin & Axial Inspection: No masses, redness, edema, swelling, or associated skin lesions Alignment: Symmetrical Functional ROM: Unrestricted ROM      Stability: No instability detected Muscle Tone/Strength: Functionally intact. No obvious neuro-muscular anomalies detected. Sensory (Neurological): Unimpaired Palpation: No palpable anomalies                    Upper Extremity (UE) Exam    Side: Right upper extremity  Side: Left upper extremity   Skin & Extremity Inspection: Skin color, temperature, and hair growth are WNL. No peripheral edema or cyanosis. No masses,  redness, swelling, asymmetry, or associated skin lesions. No contractures.  Skin & Extremity Inspection: Some redness observed, + color changes, mild edema, allodynia   Functional ROM: Unrestricted ROM          Functional ROM: Pain restricted ROM for all joints of upper extremity   Muscle Tone/Strength: Functionally intact. No obvious neuro-muscular anomalies detected.  Muscle Tone/Strength: Movement possible against gravity, but not against resistance (3/5)   Sensory (Neurological): Unimpaired          Sensory (Neurological): Neurogenic pain pattern           Palpation: No palpable anomalies  Palpation: Complains of area being tender to palpation               Provocative Test(s):  Phalen's test: deferred Tinel's test: deferred Apley's scratch test (touch opposite shoulder):  Action 1 (Across chest): deferred Action 2 (Overhead): deferred Action 3 (LB reach): deferred   Provocative Test(s):  Phalen's test: deferred Tinel's test: deferred Apley's scratch test (touch opposite shoulder):  Action 1 (Across chest): Decreased ROM Action 2 (Overhead): Decreased ROM Action 3 (LB reach): Decreased ROM      Assessment   Status Diagnosis  Persistent Persistent Persistent 1. Dysfunction of left rotator cuff   2. Chronic left shoulder pain   3. Neuropathy of left brachial plexus   4. Injury of left brachial plexus, sequela   5. Chronic pain syndrome      Updated Problems: Problem  Injury of Left Brachial Plexus  Neuropathy of Left Brachial Plexus  Chronic Left Shoulder Pain  Dysfunction of Left Rotator Cuff    Plan of Care  Tonya Randolph has a current medication list which includes the following long-term medication(s): cetirizine and gabapentin.  Pharmacotherapy (Medications Ordered): Meds ordered this encounter  Medications  . gabapentin (NEURONTIN) 600 MG tablet    Sig: Take 1 tablet (600 mg total) by mouth 4 (four) times daily as needed.     Dispense:  120 tablet    Refill:  2  . traMADol (ULTRAM) 50 MG tablet    Sig: Take 2 tablets (100 mg total) by mouth every 8 (eight) hours as needed. For chronic pain syndrome    Dispense:  90 tablet    Refill:  2  . HYDROcodone-acetaminophen (NORCO/VICODIN) 5-325 MG tablet    Sig: Take 1 tablet by mouth 2 (two) times daily as needed for severe pain. Must last 30 days.    Dispense:  60 tablet    Refill:  0    Chronic Pain. (STOP Act - Not applicable). Fill one day early if closed on scheduled refill date.   Follow-up plan:   Return in about 3 months (around 01/14/2020).     Consider left Sprint peripheral nerve stimulation of axillary nerve   Recent Visits Date Type Provider Dept  09/23/19 Office Visit [name], MD [facility]  Showing recent visits within past 90 days and meeting all other requirements Today's Visits Date Type Provider Dept  10/14/19 Office Visit [name], MD [facility]  Showing today's visits and meeting all other requirements Future Appointments Date Type Provider Dept  01/12/20 Appointment [name], MD [facility]  Showing future appointments within next 90 days and meeting all other requirements  I discussed the assessment and treatment plan with the patient. The patient was provided an opportunity to ask questions and all were answered. The patient agreed with the plan and demonstrated an understanding of the instructions.  Patient advised to call back or seek an in-person evaluation if the symptoms or condition worsens.  Duration of encounter: 30 minutes.  Note by: [name], MD Date: 10/14/2019; Time: 1:26 PM

## 2019-11-26 ENCOUNTER — Encounter: Payer: Self-pay | Admitting: Student in an Organized Health Care Education/Training Program

## 2020-01-12 ENCOUNTER — Encounter: Payer: BC Managed Care – PPO | Admitting: Student in an Organized Health Care Education/Training Program

## 2020-01-13 ENCOUNTER — Encounter: Payer: Self-pay | Admitting: Student in an Organized Health Care Education/Training Program

## 2020-01-13 ENCOUNTER — Ambulatory Visit
Payer: BC Managed Care – PPO | Attending: Student in an Organized Health Care Education/Training Program | Admitting: Student in an Organized Health Care Education/Training Program

## 2020-01-13 ENCOUNTER — Encounter: Payer: BC Managed Care – PPO | Admitting: Student in an Organized Health Care Education/Training Program

## 2020-01-13 ENCOUNTER — Other Ambulatory Visit: Payer: Self-pay

## 2020-01-13 VITALS — BP 140/89 | HR 73 | Temp 97.2°F | Ht 65.0 in | Wt 158.0 lb

## 2020-01-13 DIAGNOSIS — G8929 Other chronic pain: Secondary | ICD-10-CM | POA: Insufficient documentation

## 2020-01-13 DIAGNOSIS — M25512 Pain in left shoulder: Secondary | ICD-10-CM | POA: Insufficient documentation

## 2020-01-13 DIAGNOSIS — M67912 Unspecified disorder of synovium and tendon, left shoulder: Secondary | ICD-10-CM | POA: Diagnosis present

## 2020-01-13 DIAGNOSIS — G540 Brachial plexus disorders: Secondary | ICD-10-CM

## 2020-01-13 DIAGNOSIS — S143XXS Injury of brachial plexus, sequela: Secondary | ICD-10-CM | POA: Diagnosis present

## 2020-01-13 DIAGNOSIS — G894 Chronic pain syndrome: Secondary | ICD-10-CM | POA: Insufficient documentation

## 2020-01-13 MED ORDER — TRAMADOL HCL 50 MG PO TABS: 50.0000 mg | ORAL_TABLET | ORAL | 2 refills | Status: AC | PRN

## 2020-01-13 MED ORDER — GABAPENTIN 600 MG PO TABS: 600.0000 mg | ORAL_TABLET | Freq: Two times a day (BID) | ORAL | 2 refills | Status: AC

## 2020-01-13 NOTE — Patient Instructions (Signed)
Call when you have half a bottle of Tramadol left and make a med refill appt.

## 2020-01-13 NOTE — Progress Notes (Signed)
Nursing Pain Medication Assessment:  Safety precautions to be maintained throughout the outpatient stay will include: orient to surroundings, keep bed in low position, maintain call bell within reach at all times, provide assistance with transfer out of bed and ambulation.  Medication Inspection Compliance: Pill count conducted under aseptic conditions, in front of the patient. Neither the pills nor the bottle was removed from the patient's sight at any time. Once count was completed pills were immediately returned to the patient in their original bottle.  Medication: Tramadol (Ultram) Pill/Patch Count: 16.5 of 90 pills remain Pill/Patch Appearance: Markings consistent with prescribed medication Bottle Appearance: Standard pharmacy container. Clearly labeled. Filled Date: 09 / 08 / 2021 Last Medication intake:  Today

## 2020-01-13 NOTE — Progress Notes (Signed)
PROVIDER NOTE: Information contained herein reflects review and annotations entered in association with encounter. Interpretation of such information and data should be left to medically-trained personnel. Information provided to patient can be located elsewhere in the medical record under "Patient Instructions". Document created using STT-dictation technology, any transcriptional errors that may result from process are unintentional.    Patient: Tonya Randolph  Service Category: E/M  Provider: [name], MD  DOB: [birth year]  DOS: 01/13/2020  Specialty: Interventional Pain Management  MRN: [id number]  Setting: Ambulatory outpatient  PCP: [name], MD  Type: Established Patient    Referring Provider: [name], MD  Location: Office  Delivery: Face-to-face     HPI  Reason for encounter: Ms. Tonya Randolph, a 64 y.o. year old female, is here today for evaluation and management of her Dysfunction of left rotator cuff [M67.912]. [name] primary complain today is Hand Pain (left) and Shoulder Pain (left) Last encounter: Practice (10/14/2019). My last encounter with her was on 10/14/2019. Pertinent problems: [name] has Injury of left brachial plexus; Neuropathy of left brachial plexus; and Chronic left shoulder pain on their pertinent problem list. Pain Assessment: Severity of Chronic pain is reported as a 7 /10. Location: Hand Right/denies. Onset: More than a month ago. Quality: Burning, Stabbing (shock). Timing: Constant. Modifying factor(s): medications. Vitals:  height is 5' 5"  (1.651 m) and weight is 158 lb (71.7 kg). Her temperature is 97.2 F (36.2 C) (abnormal). Her blood pressure is 140/89 and her pulse is 73. Her oxygen saturation is 96%.   Patient follows up today for medication management.  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  She has chronic flexion of her hand due to her left brachial plexopathy.  Of note  patient has tried to reduce her tramadol intake and she is currently utilizing 200 to 225 mg of tramadol a day, 4 times daily as needed usually.  She has also reduced her gabapentin to twice daily.  She has not necessarily noticed any worsening of her pain.  She would like to try and reduce her medications to their lowest effective dose possible. Otherwise no significant changes in her medical history.   Pharmacotherapy Assessment   Analgesic: Tramadol 50 mg every 4 hours as needed, quantity 150/month; maximum 5 tablets a day   Monitoring: [place] PMP: PDMP reviewed during this encounter.       Pharmacotherapy: No side-effects or adverse reactions reported. Compliance: No problems identified. Effectiveness: Clinically acceptable.  Dewayne Shorter, RN  01/13/2020 12:08 PM  Signed Nursing Pain Medication Assessment:  Safety precautions to be maintained throughout the outpatient stay will include: orient to surroundings, keep bed in low position, maintain call bell within reach at all times, provide assistance with transfer out of bed and ambulation.  Medication Inspection Compliance: Pill count conducted under aseptic conditions, in front of the patient. Neither the pills nor the bottle was removed from the patient's sight at any time. Once count was completed pills were immediately returned to the patient in their original bottle.  Medication: Tramadol (Ultram) Pill/Patch Count: 16.5 of 90 pills remain Pill/Patch Appearance: Markings consistent with prescribed medication Bottle Appearance: Standard pharmacy container. Clearly labeled. Filled Date: 09 / 08 / 2021 Last Medication intake:  Today    UDS:  Summary  Date Value Ref Range Status  09/22/2019 Note  Final    Comment:    ==================================================================== Compliance Drug Analysis, Ur ==================================================================== Test  Result       Flag        Units  Drug Present and Declared for Prescription Verification   Tramadol                       >20000       EXPECTED   ng/mg creat   O-Desmethyltramadol            >20000       EXPECTED   ng/mg creat   N-Desmethyltramadol            10376        EXPECTED   ng/mg creat    Source of tramadol is a prescription medication. O-desmethyltramadol    and N-desmethyltramadol are expected metabolites of tramadol.    Gabapentin                     PRESENT      EXPECTED  Drug Present not Declared for Prescription Verification   Ibuprofen                      PRESENT      UNEXPECTED ==================================================================== Test                      Result    Flag   Units      Ref Range   Creatinine              25               mg/dL      >=20 ==================================================================== Declared Medications:  The flagging and interpretation on this report are based on the  following declared medications.  Unexpected results may arise from  inaccuracies in the declared medications.   **Note: The testing scope of this panel includes these medications:   Gabapentin (Neurontin)  Tramadol (Ultram)   **Note: The testing scope of this panel does not include the  following reported medications:   Celecoxib (Celebrex)  Cetirizine (Zyrtec)  Omeprazole (Prilosec) ==================================================================== For clinical consultation, please call [phone]. ====================================================================      ROS  Constitutional: Denies any fever or chills Gastrointestinal: No reported hemesis, hematochezia, vomiting, or acute GI distress  Musculoskeletal: Denies any acute onset joint swelling, redness, loss of ROM, or weakness Neurological: Neuropathic pain of left upper extremity  Medication Review  cetirizine, diclofenac, gabapentin, omeprazole, and traMADol  History Review  Allergy:  [name] has No Known Allergies. Drug: [name]  reports no history of drug use. Alcohol:  reports current alcohol use. Tobacco:  reports that she has never smoked. She has never used smokeless tobacco. Social: [name]  reports that she has never smoked. She has never used smokeless tobacco. She reports current alcohol use. She reports that she does not use drugs. Medical:  has a past medical history of Anemia (2014), Brachial plexus injury, Cancer ([place]) (2017), Colon polyp (01/12/2016), GERD (gastroesophageal reflux disease), and Heart murmur. Surgical: Ms. Sobel  has a past surgical history that includes Carpal tunnel release (1985); Tonsillectomy (1973); Colonoscopy with propofol (N/A, 12/04/2015); basal cell removal (2017); Tubal ligation (1986); Colonoscopy (N/A, 01/12/2016); Colon surgery (2017); and Colonoscopy with propofol (N/A, 02/11/2018). Family: family history includes Cancer in her mother.  Laboratory Chemistry Profile   Renal Lab Results  Component Value Date   CREATININE 0.70 07/14/2017     Hepatic No results found for: AST, ALT, ALBUMIN, ALKPHOS, HCVAB, AMYLASE,  LIPASE, AMMONIA   Electrolytes No results found for: NA, K, CL, CALCIUM, MG, PHOS   Bone No results found for: VD25OH, [id number], VX4801KP5, VZ4827MB8, 25OHVITD1, 25OHVITD2, 25OHVITD3, TESTOFREE, TESTOSTERONE   Inflammation (CRP: Acute Phase) (ESR: Chronic Phase) No results found for: CRP, ESRSEDRATE, LATICACIDVEN     Note: Above Lab results reviewed.  Recent Imaging Review  MR CHEST W WO CONTRAST CLINICAL DATA:  Left arm pain, weakness, and numbness since lifting injury in January.  EXAM: MRI BRACHIAL PLEXUS WITHOUT AND WITH CONTRAST  TECHNIQUE: Multiplanar, multiecho pulse sequences of the neck and surrounding structures were obtained without and with intravenous contrast. The field of view was focused on the left brachial plexus from the neural foramina to the  axilla.  CONTRAST:  8m GADAVIST GADOBUTROL 1 MMOL/ML IV SOLN  COMPARISON:  MRI cervical spine dated August 13, 2019. CT chest dated July 14, 2017.  FINDINGS: Spinal cord  Normal caliber and signal of visualized spinal cord.  Brachial plexus  Roots: Normal.  Trunks: Normal.  Divisions: Normal.  Cords: Normal.  Branches: Normal.  Muscles and tendons  Small focal full-thickness, partial width tear of left mid supraspinatus tendon (series 6, image 26; series 8, image 40). Edema in the left infraspinatus muscle (series 8, image 24). No muscle atrophy.  Bones  Cervical spine: Unchanged small posterior disc protrusion at C5-C6.  C7 transverse processes: Normal.  Marrow signal: Normal.  Other findings  Small left glenohumeral effusion with thin synovial enhancement. Small amount of fluid in the left subacromial/subdeltoid bursa. Mild left acromioclavicular osteoarthritis.  IMPRESSION: 1. Normal left brachial plexus. 2. Small focal full-thickness, partial width tear of the left mid supraspinatus tendon. 3. Left infraspinatus muscle strain. 4. Small left glenohumeral effusion. Mild left acromioclavicular osteoarthritis.  Electronically Signed   By: WTitus DubinM.D.   On: 09/25/2019 16:58 Note: Reviewed        Physical Exam  General appearance: Well nourished, well developed, and well hydrated. In no apparent acute distress Mental status: Alert, oriented x 3 (person, place, & time)       Respiratory: No evidence of acute respiratory distress Eyes: PERLA Vitals: BP 140/89   Pulse 73   Temp (!) 97.2 F (36.2 C)   Ht 5' 5"  (1.651 m)   Wt 158 lb (71.7 kg)   LMP  (LMP Unknown)   SpO2 96%   BMI 26.29 kg/m  BMI: Estimated body mass index is 26.29 kg/m as calculated from the following:   Height as of this encounter: 5' 5"  (1.651 m).   Weight as of this encounter: 158 lb (71.7 kg). Ideal: Ideal body weight: 57 kg (125 lb 10.6 oz) Adjusted ideal body  weight: 62.9 kg (138 lb 9.6 oz)  Cervical Spine Exam  Skin & Axial Inspection:No masses, redness, edema, swelling, or associated skin lesions Alignment:Symmetrical Functional RMLJ:[id number] Stability:No instability detected Muscle Tone/Strength:Functionally intact. No obvious neuro-muscular anomalies detected. Sensory (Neurological):Unimpaired Palpation:No palpable anomalies        Upper Extremity (UE) Exam    Side:Right upper extremity  Side:Left upper extremity   Skin & Extremity Inspection:Skin color, temperature, and hair growth are WNL. No peripheral edema or cyanosis. No masses, redness, swelling, asymmetry, or associated skin lesions. No contractures.  Skin & Extremity Inspection:Some redness observed, + color changes, mild edema, allodynia   Functional RFXJ:[id number]  Functional RRAX:ENMMrestricted ROMfor all joints of upper extremity   Muscle Tone/Strength:Functionally intact. No obvious neuro-muscular anomalies detected.  Muscle Tone/Strength:Movement possible against gravity, but  not against resistance (3/5)   Sensory (Neurological):Unimpaired  Sensory (Neurological):Neurogenic pain pattern   Palpation:No palpable anomalies  Palpation:Complains of area being tender to palpation   Provocative Test(s): Phalen's test:deferred Tinel's test:deferred Apley's scratch test (touch opposite shoulder): Action 1 (Across chest):deferred Action 2 (Overhead):deferred Action 3 (LB reach):deferred   Provocative Test(s): Phalen's test:deferred Tinel's test:deferred Apley's scratch test (touch opposite shoulder): Action 1 (Across chest):Decreased ROM Action 2 (Overhead):Decreased ROM Action 3 (LB reach):Decreased ROM      Assessment   Status Diagnosis  Controlled Controlled Controlled 1. Dysfunction of left rotator cuff   2. Chronic left shoulder  pain   3. Neuropathy of left brachial plexus   4. Injury of left brachial plexus, sequela   5. Chronic pain syndrome      Updated Problems: Problem  Chronic Pain Syndrome    Plan of Care   Ms. DENEISHA DADE has a current medication list which includes the following long-term medication(s): cetirizine and gabapentin.  Pharmacotherapy (Medications Ordered): Meds ordered this encounter  Medications  . traMADol (ULTRAM) 50 MG tablet    Sig: Take 1 tablet (50 mg total) by mouth every 4 (four) hours as needed. Max 5 tablets/day. For chronic pain syndrome.    Dispense:  150 tablet    Refill:  2  . gabapentin (NEURONTIN) 600 MG tablet    Sig: Take 1 tablet (600 mg total) by mouth 2 (two) times daily.    Dispense:  60 tablet    Refill:  2    Follow-up plan:   Return in about 3 months (around 04/13/2020) for Medication Management, in person.     Consider left Sprint peripheral nerve stimulation of axillary nerve    Recent Visits No visits were found meeting these conditions. Showing recent visits within past 90 days and meeting all other requirements Today's Visits Date Type Provider Dept  01/13/20 Office Visit [name], MD [facility]  Showing today's visits and meeting all other requirements Future Appointments Date Type Provider Dept  04/06/20 Appointment [name], MD [facility]  Showing future appointments within next 90 days and meeting all other requirements  I discussed the assessment and treatment plan with the patient. The patient was provided an opportunity to ask questions and all were answered. The patient agreed with the plan and demonstrated an understanding of the instructions.  Patient advised to call back or seek an in-person evaluation if the symptoms or condition worsens.  Duration of encounter: 30 minutes.  Note by: [name], MD Date: 01/13/2020; Time: 1:25 PM

## 2020-04-06 ENCOUNTER — Encounter: Payer: Self-pay | Admitting: Student in an Organized Health Care Education/Training Program

## 2020-04-06 ENCOUNTER — Ambulatory Visit
Payer: BC Managed Care – PPO | Attending: Student in an Organized Health Care Education/Training Program | Admitting: Student in an Organized Health Care Education/Training Program

## 2020-04-06 ENCOUNTER — Other Ambulatory Visit: Payer: Self-pay

## 2020-04-06 VITALS — BP 108/64 | HR 60 | Temp 97.9°F | Resp 18 | Ht 65.0 in | Wt 149.0 lb

## 2020-04-06 DIAGNOSIS — S143XXS Injury of brachial plexus, sequela: Secondary | ICD-10-CM

## 2020-04-06 DIAGNOSIS — G894 Chronic pain syndrome: Secondary | ICD-10-CM

## 2020-04-06 DIAGNOSIS — M67912 Unspecified disorder of synovium and tendon, left shoulder: Secondary | ICD-10-CM | POA: Diagnosis not present

## 2020-04-06 DIAGNOSIS — G8929 Other chronic pain: Secondary | ICD-10-CM | POA: Diagnosis present

## 2020-04-06 DIAGNOSIS — M25512 Pain in left shoulder: Secondary | ICD-10-CM

## 2020-04-06 DIAGNOSIS — G540 Brachial plexus disorders: Secondary | ICD-10-CM | POA: Diagnosis present

## 2020-04-06 MED ORDER — TRAMADOL HCL 50 MG PO TABS: 50.0000 mg | ORAL_TABLET | Freq: Four times a day (QID) | ORAL | 2 refills | Status: DC | PRN

## 2020-04-06 NOTE — Progress Notes (Signed)
Nursing Pain Medication Assessment:  Safety precautions to be maintained throughout the outpatient stay will include: orient to surroundings, keep bed in low position, maintain call bell within reach at all times, provide assistance with transfer out of bed and ambulation.  Medication Inspection Compliance: Pill count conducted under aseptic conditions, in front of the patient. Neither the pills nor the bottle was removed from the patient's sight at any time. Once count was completed pills were immediately returned to the patient in their original bottle.  Medication: Tramadol (Ultram) Pill/Patch Count: 145.5 of 150 pills remain Pill/Patch Appearance: Markings consistent with prescribed medication Bottle Appearance: Standard pharmacy container. Clearly labeled. Filled Date: 35 / 09 / 2021 Last Medication intake:  Today

## 2020-04-06 NOTE — Progress Notes (Signed)
PROVIDER NOTE: Information contained herein reflects review and annotations entered in association with encounter. Interpretation of such information and data should be left to medically-trained personnel. Information provided to patient can be located elsewhere in the medical record under "Patient Instructions". Document created using STT-dictation technology, any transcriptional errors that may result from process are unintentional.    Patient: Tonya Randolph  Service Category: E/M  Provider: [name], MD  DOB: April 11, 1956  DOS: 04/06/2020  Specialty: Interventional Pain Management  MRN: [id number]  Setting: Ambulatory outpatient  PCP: [name], MD  Type: Established Patient    Referring Provider: [name], MD  Location: Office  Delivery: Face-to-face     HPI  Tonya Randolph, a 64 y.o. 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 Tonya Randolph primary complain today is Arm Pain (To left hand) Last encounter: My last encounter with her was on 01/13/2020. Pertinent problems: Tonya Randolph has Injury of left brachial plexus; Neuropathy of left brachial plexus; and Chronic left shoulder pain on their pertinent problem list. Pain Assessment: Severity of Chronic pain is reported as a 3 /10. Location: Arm Left/to hand. Onset: More than a month ago. Quality: Aching,Burning,Sharp,Shooting. Timing: Constant. Modifying factor(s): meds. Vitals:  height is 5' 5" (1.651 m) and weight is 149 lb (67.6 kg). Her temporal temperature is 97.9 F (36.6 C). Her blood pressure is 108/64 and her pulse is 60. Her respiration is 18 and oxygen saturation is 99%.   Reason for encounter: medication management.    No change in medical history since last visit.  Patient's pain is at baseline.  Patient continues multimodal pain regimen as prescribed.  States that it provides pain relief and improvement in functional status however dealing with  abdominal pain likely secondary to constipation. Discussed use of stool softener and miralax to assist with bowel movements.  Has been evaluated by Neurology for brachial plexopathy on 12/13821 Dr Werner Lean and has subsequently referred to [facility] for her condition, upcoming appointment early January.  Otherwise has reduced Tramadol intake to q6 hrs (4 tablets a day). Continues Gabapentin as prescribed.  Pharmacotherapy Assessment   Analgesic: Tramadol 50 mg every 4-6 hours as needed, quantity  Reduction to 120/month; maximum 4 tablets a day   Monitoring: [place] PMP: PDMP not reviewed this encounter.       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 Effectiveness: Clinically acceptable.  Rise Patience, RN  04/06/2020 11:13 AM  Sign when Signing Visit Nursing Pain Medication Assessment:  Safety precautions to be maintained throughout the outpatient stay will include: orient to surroundings, keep bed in low position, maintain call bell within reach at all times, provide assistance with transfer out of bed and ambulation.  Medication Inspection Compliance: Pill count conducted under aseptic conditions, in front of the patient. Neither the pills nor the bottle was removed from the patient's sight at any time. Once count was completed pills were immediately returned to the patient in their original bottle.  Medication: Tramadol (Ultram) Pill/Patch Count: 145.5 of 150 pills remain Pill/Patch Appearance: Markings consistent with prescribed medication Bottle Appearance: Standard pharmacy container. Clearly labeled. Filled Date: 31 / 09 / 2021 Last Medication intake:  Today    UDS:  Summary  Date Value Ref Range Status  09/22/2019 Note  Final    Comment:    ==================================================================== Compliance Drug Analysis,  Ur ==================================================================== Test  Result       Flag       Units  Drug Present and Declared for Prescription Verification   Tramadol                       >20000       EXPECTED   ng/mg creat   O-Desmethyltramadol            >20000       EXPECTED   ng/mg creat   N-Desmethyltramadol            10376        EXPECTED   ng/mg creat    Source of tramadol is a prescription medication. O-desmethyltramadol    and N-desmethyltramadol are expected metabolites of tramadol.    Gabapentin                     PRESENT      EXPECTED  Drug Present not Declared for Prescription Verification   Ibuprofen                      PRESENT      UNEXPECTED ==================================================================== Test                      Result    Flag   Units      Ref Range   Creatinine              25               mg/dL      >=20 ==================================================================== Declared Medications:  The flagging and interpretation on this report are based on the  following declared medications.  Unexpected results may arise from  inaccuracies in the declared medications.   **Note: The testing scope of this panel includes these medications:   Gabapentin (Neurontin)  Tramadol (Ultram)   **Note: The testing scope of this panel does not include the  following reported medications:   Celecoxib (Celebrex)  Cetirizine (Zyrtec)  Omeprazole (Prilosec) ==================================================================== For clinical consultation, please call [phone]. ====================================================================      ROS  Constitutional: Denies any fever or chills Gastrointestinal: No reported hemesis, hematochezia, vomiting, or acute GI distress Musculoskeletal: Denies any acute onset joint swelling, redness, loss of ROM, or weakness Neurological: left arm pain and  weakness  Medication Review  cetirizine, gabapentin, omeprazole, and traMADol  History Review  Allergy: Tonya Randolph has No Known Allergies. Drug: Tonya Randolph  reports no history of drug use. Alcohol:  reports current alcohol use. Tobacco:  reports that she has never smoked. She has never used smokeless tobacco. Social: Tonya Randolph  reports that she has never smoked. She has never used smokeless tobacco. She reports current alcohol use. She reports that she does not use drugs. Medical:  has a past medical history of Anemia (2014), Brachial plexus injury, Cancer ([place]) (2017), Colon polyp (01/12/2016), GERD (gastroesophageal reflux disease), and Heart murmur. Surgical: Tonya Randolph  has a past surgical history that includes Carpal tunnel release (1985); Tonsillectomy (1973); Colonoscopy with propofol (N/A, 12/04/2015); basal cell removal (2017); Tubal ligation (1986); Colonoscopy (N/A, 01/12/2016); Colon surgery (2017); and Colonoscopy with propofol (N/A, 02/11/2018). Family: family history includes Cancer in her mother.  Laboratory Chemistry Profile   Renal Lab Results  Component Value Date   CREATININE 0.70 07/14/2017     Hepatic No results found for: AST, ALT, ALBUMIN, ALKPHOS, HCVAB, AMYLASE, LIPASE, AMMONIA  Electrolytes No results found for: NA, K, CL, CALCIUM, MG, PHOS   Bone No results found for: VD25OH, [id number], HM0947SJ6, GE3662HU7, 25OHVITD1, 25OHVITD2, 25OHVITD3, TESTOFREE, TESTOSTERONE   Inflammation (CRP: Acute Phase) (ESR: Chronic Phase) No results found for: CRP, ESRSEDRATE, LATICACIDVEN     Note: Above Lab results reviewed.  Recent Imaging Review  MR CHEST W WO CONTRAST CLINICAL DATA:  Left arm pain, weakness, and numbness since lifting injury in January.  EXAM: MRI BRACHIAL PLEXUS WITHOUT AND WITH CONTRAST  TECHNIQUE: Multiplanar, multiecho pulse sequences of the neck and surrounding structures were obtained without and with intravenous contrast.  The field of view was focused on the left brachial plexus from the neural foramina to the axilla.  CONTRAST:  69m GADAVIST GADOBUTROL 1 MMOL/ML IV SOLN  COMPARISON:  MRI cervical spine dated August 13, 2019. CT chest dated July 14, 2017.  FINDINGS: Spinal cord  Normal caliber and signal of visualized spinal cord.  Brachial plexus  Roots: Normal.  Trunks: Normal.  Divisions: Normal.  Cords: Normal.  Branches: Normal.  Muscles and tendons  Small focal full-thickness, partial width tear of left mid supraspinatus tendon (series 6, image 26; series 8, image 40). Edema in the left infraspinatus muscle (series 8, image 24). No muscle atrophy.  Bones  Cervical spine: Unchanged small posterior disc protrusion at C5-C6.  C7 transverse processes: Normal.  Marrow signal: Normal.  Other findings  Small left glenohumeral effusion with thin synovial enhancement. Small amount of fluid in the left subacromial/subdeltoid bursa. Mild left acromioclavicular osteoarthritis.  IMPRESSION: 1. Normal left brachial plexus. 2. Small focal full-thickness, partial width tear of the left mid supraspinatus tendon. 3. Left infraspinatus muscle strain. 4. Small left glenohumeral effusion. Mild left acromioclavicular osteoarthritis.  Electronically Signed   By: WTitus DubinM.D.   On: 09/25/2019 16:58 Note: Reviewed        Physical Exam  General appearance: Well nourished, well developed, and well hydrated. In no apparent acute distress Mental status: Alert, oriented x 3 (person, place, & time)       Respiratory: No evidence of acute respiratory distress Eyes: PERLA Vitals: BP 108/64   Pulse 60   Temp 97.9 F (36.6 C) (Temporal)   Resp 18   Ht 5' 5" (1.651 m)   Wt 149 lb (67.6 kg)   LMP  (LMP Unknown)   SpO2 99%   BMI 24.79 kg/m  BMI: Estimated body mass index is 24.79 kg/m as calculated from the following:   Height as of this encounter: 5' 5" (1.651 m).   Weight as of  this encounter: 149 lb (67.6 kg). Ideal: Ideal body weight: 57 kg (125 lb 10.6 oz) Adjusted ideal body weight: 61.2 kg (135 lb)   Cervical Spine Exam  Skin & Axial Inspection:No masses, redness, edema, swelling, or associated skin lesions Alignment:Symmetrical Functional RMLY:[id number] Stability:No instability detected Muscle Tone/Strength:Functionally intact. No obvious neuro-muscular anomalies detected. Sensory (Neurological):Unimpaired Palpation:No palpable anomalies        Upper Extremity (UE) Exam    Side:Right upper extremity  Side:Left upper extremity   Skin & Extremity Inspection:Skin color, temperature, and hair growth are WNL. No peripheral edema or cyanosis. No masses, redness, swelling, asymmetry, or associated skin lesions. No contractures.  Skin & Extremity Inspection:Some redness observed, + color changes, mild edema, allodynia   Functional RXNT:[id number]  Functional RRFF:MBWGrestricted ROMfor all joints of upper extremity   Muscle Tone/Strength:Functionally intact. No obvious neuro-muscular anomalies detected.  Muscle Tone/Strength:Movement possible against gravity, but not  against resistance (3/5)   Sensory (Neurological):Unimpaired  Sensory (Neurological):Neurogenic pain pattern   Palpation:No palpable anomalies  Palpation:Complains of area being tender to palpation   Provocative Test(s): Phalen's test:deferred Tinel's test:deferred Apley's scratch test (touch opposite shoulder): Action 1 (Across chest):deferred Action 2 (Overhead):deferred Action 3 (LB reach):deferred   Provocative Test(s): Phalen's test:deferred Tinel's test:deferred Apley's scratch test (touch opposite shoulder): Action 1 (Across chest):Decreased ROM Action 2 (Overhead):Decreased ROM Action 3 (LB reach):Decreased ROM        Assessment   Status Diagnosis   Controlled Controlled Controlled 1. Neuropathy of left brachial plexus   2. Dysfunction of left rotator cuff   3. Chronic left shoulder pain   4. Injury of left brachial plexus, sequela   5. Chronic pain syndrome       Plan of Care   Tonya Randolph has a current medication list which includes the following long-term medication(s): cetirizine and gabapentin.  Pharmacotherapy (Medications Ordered): Meds ordered this encounter  Medications  . traMADol (ULTRAM) 50 MG tablet    Sig: Take 1 tablet (50 mg total) by mouth every 6 (six) hours as needed.    Dispense:  120 tablet    Refill:  2   Continue Gabapentin as prescribed  Follow-up plan:   Return for PRN.     Consider left Sprint peripheral nerve stimulation of axillary nerve or suprascapular nerve    Recent Visits Date Type Provider Dept  01/13/20 Office Visit [name], MD [facility]  Showing recent visits within past 90 days and meeting all other requirements Today's Visits Date Type Provider Dept  04/06/20 Office Visit [name], MD [facility]  Showing today's visits and meeting all other requirements Future Appointments No visits were found meeting these conditions. Showing future appointments within next 90 days and meeting all other requirements  I discussed the assessment and treatment plan with the patient. The patient was provided an opportunity to ask questions and all were answered. The patient agreed with the plan and demonstrated an understanding of the instructions.  Patient advised to call back or seek an in-person evaluation if the symptoms or condition worsens.  Duration of encounter: 80mnutes.  Note by: BGillis Santa MD Date: 04/06/2020; Time: 11:49 AM

## 2020-04-24 ENCOUNTER — Telehealth: Payer: Self-pay | Admitting: Student in an Organized Health Care Education/Training Program

## 2020-04-24 NOTE — Telephone Encounter (Signed)
Pharmacy called stating there is a problem with tramadol script please call to verify

## 2020-04-24 NOTE — Telephone Encounter (Signed)
Called [facility] and spoke with them about Rx.  Will honor the last fill on the qty of 150 and then will go the next Rx that was to fill on 05/16/20 for qty of 120.

## 2020-09-06 ENCOUNTER — Ambulatory Visit
Payer: BC Managed Care – PPO | Attending: Student in an Organized Health Care Education/Training Program | Admitting: Student in an Organized Health Care Education/Training Program

## 2020-09-06 ENCOUNTER — Other Ambulatory Visit: Payer: Self-pay

## 2020-09-06 ENCOUNTER — Encounter: Payer: Self-pay | Admitting: Student in an Organized Health Care Education/Training Program

## 2020-09-06 VITALS — BP 122/74 | HR 100 | Temp 97.1°F | Resp 16 | Ht 65.0 in | Wt 149.0 lb

## 2020-09-06 DIAGNOSIS — M25512 Pain in left shoulder: Secondary | ICD-10-CM | POA: Diagnosis not present

## 2020-09-06 DIAGNOSIS — G8929 Other chronic pain: Secondary | ICD-10-CM

## 2020-09-06 DIAGNOSIS — M67912 Unspecified disorder of synovium and tendon, left shoulder: Secondary | ICD-10-CM | POA: Diagnosis not present

## 2020-09-06 DIAGNOSIS — S143XXS Injury of brachial plexus, sequela: Secondary | ICD-10-CM | POA: Diagnosis not present

## 2020-09-06 DIAGNOSIS — G540 Brachial plexus disorders: Secondary | ICD-10-CM

## 2020-09-06 DIAGNOSIS — G894 Chronic pain syndrome: Secondary | ICD-10-CM

## 2020-09-06 MED ORDER — AMITRIPTYLINE HCL 25 MG PO TABS: ORAL_TABLET | ORAL | 0 refills | Status: AC

## 2020-09-06 NOTE — Progress Notes (Signed)
PROVIDER NOTE: Information contained herein reflects review and annotations entered in association with encounter. Interpretation of such information and data should be left to medically-trained personnel. Information provided to patient can be located elsewhere in the medical record under "Patient Instructions". Document created using STT-dictation technology, any transcriptional errors that may result from process are unintentional.    Patient: Tonya Randolph  Service Category: E/M  Provider: [name], MD  DOB: 1956/01/08  DOS: 09/06/2020  Specialty: Interventional Pain Management  MRN: [id number]  Setting: Ambulatory outpatient  PCP: [name], MD  Type: Established Patient    Referring Provider: [name], MD  Location: Office  Delivery: Face-to-face     HPI  Tonya Randolph, a 65 y.o. 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 Ms. Mentel primary complain today is left arm pain  Last encounter: My last encounter with her was on 04/24/2020. Pertinent problems: Tonya Randolph has Injury of left brachial plexus; Neuropathy of left brachial plexus; and Chronic left shoulder pain on their pertinent problem list. Pain Assessment: Severity of Chronic pain is reported as a 3 /10. Location: Arm Left/pain goes from forearm  and down to the hand.. Onset: More than a month ago. Quality: Discomfort,Constant,Sharp,Tingling,Numbness. Timing: Constant. Modifying factor(s): medications. Vitals:  height is 5' 5"  (1.651 m) and weight is 149 lb (67.6 kg). Her temporal temperature is 97.1 F (36.2 C) (abnormal). Her blood pressure is 122/74 and her pulse is 100. Her respiration is 16 and oxygen saturation is 100%.   Reason for encounter: medication management.    Ms. Dosch presents today for medication management.  No significant changes in her medical history.  She does find analgesic benefit with gabapentin more more so than  she does with tramadol.  She is hoping to decrease her tramadol and wean off of it.  I have instructed her to start amitriptyline at night with titration instructions provided below.  I provided her instructions of how to decrease her tramadol.  I will have the patient follow-up with me in 8 weeks to see how she is doing on amitriptyline.  Pharmacotherapy Assessment   Analgesic: Tramadol 50 mg every 4-6 hours as needed, quantity  Reduction to 120/month; maximum 4 tablets a day   Monitoring: [place] PMP: PDMP not reviewed this encounter.       Pharmacotherapy: No side-effects or adverse reactions reported. Compliance: No problems identified. Effectiveness: Clinically acceptable.  Janett Billow, RN  09/06/2020  2:53 PM  Sign when Signing Visit Nursing Pain Medication Assessment:  Safety precautions to be maintained throughout the outpatient stay will include: orient to surroundings, keep bed in low position, maintain call bell within reach at all times, provide assistance with transfer out of bed and ambulation.  Medication Inspection Compliance: Pill count conducted under aseptic conditions, in front of the patient. Neither the pills nor the bottle was removed from the patient's sight at any time. Once count was completed pills were immediately returned to the patient in their original bottle.  Medication: Tramadol (Ultram) Pill/Patch Count: 243.5 of 120 pills remain Pill/Patch Appearance: Markings consistent with prescribed medication Bottle Appearance: Standard pharmacy container. Clearly labeled. Filled Date: 04 / 23 / 2022 Last Medication intake:  Today   Patient reports there are left over medications added to this current bottle.    UDS:  Summary  Date Value Ref Range Status  09/22/2019 Note  Final    Comment:    ====================================================================  Compliance Drug Analysis,  Ur ==================================================================== Test                             Result       Flag       Units  Drug Present and Declared for Prescription Verification   Tramadol                       >20000       EXPECTED   ng/mg creat   O-Desmethyltramadol            >20000       EXPECTED   ng/mg creat   N-Desmethyltramadol            10376        EXPECTED   ng/mg creat    Source of tramadol is a prescription medication. O-desmethyltramadol    and N-desmethyltramadol are expected metabolites of tramadol.    Gabapentin                     PRESENT      EXPECTED  Drug Present not Declared for Prescription Verification   Ibuprofen                      PRESENT      UNEXPECTED ==================================================================== Test                      Result    Flag   Units      Ref Range   Creatinine              25               mg/dL      >=20 ==================================================================== Declared Medications:  The flagging and interpretation on this report are based on the  following declared medications.  Unexpected results may arise from  inaccuracies in the declared medications.   **Note: The testing scope of this panel includes these medications:   Gabapentin (Neurontin)  Tramadol (Ultram)   **Note: The testing scope of this panel does not include the  following reported medications:   Celecoxib (Celebrex)  Cetirizine (Zyrtec)  Omeprazole (Prilosec) ==================================================================== For clinical consultation, please call [phone]. ====================================================================      ROS  Constitutional: Denies any fever or chills Gastrointestinal: No reported hemesis, hematochezia, vomiting, or acute GI distress Musculoskeletal: Left upper extremity arm pain Neurological: No reported episodes of acute onset apraxia, aphasia, dysarthria,  agnosia, amnesia, paralysis, loss of coordination, or loss of consciousness  Medication Review  amitriptyline, cetirizine, gabapentin, and omeprazole  History Review  Allergy: Ms. Guisinger has No Known Allergies. Drug: Ms. Rettke  reports no history of drug use. Alcohol:  reports current alcohol use. Tobacco:  reports that she has never smoked. She has never used smokeless tobacco. Social: Ms. Villacres  reports that she has never smoked. She has never used smokeless tobacco. She reports current alcohol use. She reports that she does not use drugs. Medical:  has a past medical history of Anemia (2014), Brachial plexus injury, Cancer ([place]) (2017), Colon polyp (01/12/2016), GERD (gastroesophageal reflux disease), and Heart murmur. Surgical: Ms. Ortlieb  has a past surgical history that includes Carpal tunnel release (1985); Tonsillectomy (1973); Colonoscopy with propofol (N/A, 12/04/2015); basal cell removal (2017); Tubal ligation (1986); Colonoscopy (N/A, 01/12/2016); Colon surgery (2017); and Colonoscopy with propofol (N/A, 02/11/2018). Family: family history  includes Cancer in her mother.  Laboratory Chemistry Profile   Renal Lab Results  Component Value Date   CREATININE 0.70 07/14/2017     Hepatic No results found for: AST, ALT, ALBUMIN, ALKPHOS, HCVAB, AMYLASE, LIPASE, AMMONIA   Electrolytes No results found for: NA, K, CL, CALCIUM, MG, PHOS   Bone No results found for: VD25OH, [id number], UU7253GU4, QI3474QV9, 25OHVITD1, 25OHVITD2, 25OHVITD3, TESTOFREE, TESTOSTERONE   Inflammation (CRP: Acute Phase) (ESR: Chronic Phase) No results found for: CRP, ESRSEDRATE, LATICACIDVEN     Note: Above Lab results reviewed.  Recent Imaging Review  MR CHEST W WO CONTRAST CLINICAL DATA:  Left arm pain, weakness, and numbness since lifting injury in January.  EXAM: MRI BRACHIAL PLEXUS WITHOUT AND WITH CONTRAST  TECHNIQUE: Multiplanar, multiecho pulse sequences of the neck and  surrounding structures were obtained without and with intravenous contrast. The field of view was focused on the left brachial plexus from the neural foramina to the axilla.  CONTRAST:  67m GADAVIST GADOBUTROL 1 MMOL/ML IV SOLN  COMPARISON:  MRI cervical spine dated August 13, 2019. CT chest dated July 14, 2017.  FINDINGS: Spinal cord  Normal caliber and signal of visualized spinal cord.  Brachial plexus  Roots: Normal.  Trunks: Normal.  Divisions: Normal.  Cords: Normal.  Branches: Normal.  Muscles and tendons  Small focal full-thickness, partial width tear of left mid supraspinatus tendon (series 6, image 26; series 8, image 40). Edema in the left infraspinatus muscle (series 8, image 24). No muscle atrophy.  Bones  Cervical spine: Unchanged small posterior disc protrusion at C5-C6.  C7 transverse processes: Normal.  Marrow signal: Normal.  Other findings  Small left glenohumeral effusion with thin synovial enhancement. Small amount of fluid in the left subacromial/subdeltoid bursa. Mild left acromioclavicular osteoarthritis.  IMPRESSION: 1. Normal left brachial plexus. 2. Small focal full-thickness, partial width tear of the left mid supraspinatus tendon. 3. Left infraspinatus muscle strain. 4. Small left glenohumeral effusion. Mild left acromioclavicular osteoarthritis.  Electronically Signed   By: WTitus DubinM.D.   On: 09/25/2019 16:58 Note: Reviewed        Physical Exam  General appearance: Well nourished, well developed, and well hydrated. In no apparent acute distress Mental status: Alert, oriented x 3 (person, place, & time)       Respiratory: No evidence of acute respiratory distress Eyes: PERLA Vitals: BP 122/74 (BP Location: Right Arm, Patient Position: Sitting, Cuff Size: Normal)   Pulse 100   Temp (!) 97.1 F (36.2 C) (Temporal)   Resp 16   Ht 5' 5"  (1.651 m)   Wt 149 lb (67.6 kg)   LMP  (LMP Unknown)   SpO2 100%   BMI 24.79  kg/m  BMI: Estimated body mass index is 24.79 kg/m as calculated from the following:   Height as of this encounter: 5' 5"  (1.651 m).   Weight as of this encounter: 149 lb (67.6 kg). Ideal: Ideal body weight: 57 kg (125 lb 10.6 oz) Adjusted ideal body weight: 61.2 kg (135 lb)  Cervical Spine Exam  Skin & Axial Inspection:No masses, redness, edema, swelling, or associated skin lesions Alignment:Symmetrical Functional RDGL:OVFIEPPIRJJOROM Stability:No instability detected Muscle Tone/Strength:Functionally intact. No obvious neuro-muscular anomalies detected. Sensory (Neurological):Unimpaired Palpation:No palpable anomalies        Upper Extremity (UE) Exam    Side:Right upper extremity  Side:Left upper extremity   Skin & Extremity Inspection:Skin color, temperature, and hair growth are WNL. No peripheral edema or cyanosis. No masses, redness, swelling, asymmetry,  or associated skin lesions. No contractures.  Skin & Extremity Inspection:Some redness observed, + color changes, mild edema, allodynia   Functional JQZ:[id number] ROM  Functional QJF:HLKT restricted ROMfor all joints of upper extremity   Muscle Tone/Strength:Functionally intact. No obvious neuro-muscular anomalies detected.  Muscle Tone/Strength:Movement possible against gravity, but not against resistance (3/5)   Sensory (Neurological):Unimpaired  Sensory (Neurological):Neurogenic pain pattern   Palpation:No palpable anomalies  Palpation:Complains of area being tender to palpation   Provocative Test(s): Phalen's test:deferred Tinel's test:deferred Apley's scratch test (touch opposite shoulder): Action 1 (Across chest):deferred Action 2 (Overhead):deferred Action 3 (LB reach):deferred   Provocative Test(s): Phalen's test:deferred Tinel's test:deferred Apley's scratch test (touch opposite shoulder): Action  1 (Across chest):Decreased ROM Action 2 (Overhead):Decreased ROM Action 3 (LB reach):Decreased ROM     Assessment   Status Diagnosis  Controlled Controlled Controlled 1. Neuropathy of left brachial plexus   2. Dysfunction of left rotator cuff   3. Injury of left brachial plexus, sequela   4. Chronic left shoulder pain   5. Chronic pain syndrome      Plan of Care  Ms. MILTA CROSON has a current medication list which includes the following long-term medication(s): amitriptyline, cetirizine, and gabapentin.  Pharmacotherapy (Medications Ordered): Meds ordered this encounter  Medications  . amitriptyline (ELAVIL) 25 MG tablet    Sig: Take 1 tablet (25 mg total) by mouth at bedtime for 30 days, THEN 2 tablets (50 mg total) at bedtime.    Dispense:  90 tablet    Refill:  0   Continue gabapentin 600 mg twice a day  Follow-up plan:   Return in about 8 weeks (around 11/01/2020) for Medication Management, in person.     Consider left Sprint peripheral nerve stimulation of axillary nerve or suprascapular nerve     Recent Visits No visits were found meeting these conditions. Showing recent visits within past 90 days and meeting all other requirements Today's Visits Date Type Provider Dept  09/06/20 Office Visit [name], MD [facility]  Showing today's visits and meeting all other requirements Future Appointments No visits were found meeting these conditions. Showing future appointments within next 90 days and meeting all other requirements  I discussed the assessment and treatment plan with the patient. The patient was provided an opportunity to ask questions and all were answered. The patient agreed with the plan and demonstrated an understanding of the instructions.  Patient advised to call back or seek an in-person evaluation if the symptoms or condition worsens.  Duration of encounter: 20 minutes.  Note by: [name], MD Date: 09/06/2020;  Time: 3:12 PM

## 2020-09-06 NOTE — Progress Notes (Signed)
Nursing Pain Medication Assessment:  Safety precautions to be maintained throughout the outpatient stay will include: orient to surroundings, keep bed in low position, maintain call bell within reach at all times, provide assistance with transfer out of bed and ambulation.  Medication Inspection Compliance: Pill count conducted under aseptic conditions, in front of the patient. Neither the pills nor the bottle was removed from the patient's sight at any time. Once count was completed pills were immediately returned to the patient in their original bottle.  Medication: Tramadol (Ultram) Pill/Patch Count: 243.5 of 120 pills remain Pill/Patch Appearance: Markings consistent with prescribed medication Bottle Appearance: Standard pharmacy container. Clearly labeled. Filled Date: 04 / 23 / 2022 Last Medication intake:  Today   Patient reports there are left over medications added to this current bottle.

## 2020-09-14 ENCOUNTER — Encounter: Payer: Self-pay | Admitting: Student in an Organized Health Care Education/Training Program

## 2020-10-17 ENCOUNTER — Encounter: Payer: Self-pay | Admitting: *Deleted

## 2020-11-02 ENCOUNTER — Encounter: Payer: BC Managed Care – PPO | Admitting: Student in an Organized Health Care Education/Training Program

## 2020-11-03 ENCOUNTER — Other Ambulatory Visit: Payer: Self-pay | Admitting: Student in an Organized Health Care Education/Training Program

## 2021-03-15 ENCOUNTER — Encounter: Payer: BC Managed Care – PPO | Admitting: Student in an Organized Health Care Education/Training Program

## 2022-05-03 ENCOUNTER — Other Ambulatory Visit: Payer: Self-pay | Admitting: Orthopedic Surgery

## 2022-05-03 DIAGNOSIS — S46001A Unspecified injury of muscle(s) and tendon(s) of the rotator cuff of right shoulder, initial encounter: Secondary | ICD-10-CM

## 2022-05-15 ENCOUNTER — Ambulatory Visit
Admission: RE | Admit: 2022-05-15 | Discharge: 2022-05-15 | Disposition: A | Discharge status: Home / Self Care | Payer: Self-pay | Source: Ambulatory Visit | Attending: Orthopedic Surgery | Admitting: Orthopedic Surgery

## 2022-05-15 DIAGNOSIS — S46001A Unspecified injury of muscle(s) and tendon(s) of the rotator cuff of right shoulder, initial encounter: Secondary | ICD-10-CM

## 2022-07-22 DIAGNOSIS — M75121 Complete rotator cuff tear or rupture of right shoulder, not specified as traumatic: Secondary | ICD-10-CM | POA: Diagnosis not present

## 2022-08-19 DIAGNOSIS — L720 Epidermal cyst: Secondary | ICD-10-CM | POA: Diagnosis not present

## 2022-10-31 DIAGNOSIS — G540 Brachial plexus disorders: Secondary | ICD-10-CM | POA: Diagnosis not present

## 2022-10-31 DIAGNOSIS — Z0000 Encounter for general adult medical examination without abnormal findings: Secondary | ICD-10-CM | POA: Diagnosis not present

## 2022-10-31 DIAGNOSIS — M8580 Other specified disorders of bone density and structure, unspecified site: Secondary | ICD-10-CM | POA: Diagnosis not present

## 2022-10-31 DIAGNOSIS — K219 Gastro-esophageal reflux disease without esophagitis: Secondary | ICD-10-CM | POA: Diagnosis not present

## 2022-10-31 DIAGNOSIS — J309 Allergic rhinitis, unspecified: Secondary | ICD-10-CM | POA: Diagnosis not present

## 2022-10-31 DIAGNOSIS — M75121 Complete rotator cuff tear or rupture of right shoulder, not specified as traumatic: Secondary | ICD-10-CM | POA: Diagnosis not present

## 2022-12-04 DIAGNOSIS — L718 Other rosacea: Secondary | ICD-10-CM | POA: Diagnosis not present

## 2022-12-04 DIAGNOSIS — L738 Other specified follicular disorders: Secondary | ICD-10-CM | POA: Diagnosis not present

## 2022-12-04 DIAGNOSIS — L570 Actinic keratosis: Secondary | ICD-10-CM | POA: Diagnosis not present

## 2023-01-23 DIAGNOSIS — H5203 Hypermetropia, bilateral: Secondary | ICD-10-CM | POA: Diagnosis not present

## 2023-02-03 DIAGNOSIS — L718 Other rosacea: Secondary | ICD-10-CM | POA: Diagnosis not present

## 2023-02-10 DIAGNOSIS — H6591 Unspecified nonsuppurative otitis media, right ear: Secondary | ICD-10-CM | POA: Diagnosis not present

## 2023-02-10 DIAGNOSIS — J36 Peritonsillar abscess: Secondary | ICD-10-CM | POA: Diagnosis not present

## 2023-02-10 DIAGNOSIS — J302 Other seasonal allergic rhinitis: Secondary | ICD-10-CM | POA: Diagnosis not present

## 2023-05-06 DIAGNOSIS — R739 Hyperglycemia, unspecified: Secondary | ICD-10-CM | POA: Diagnosis not present

## 2023-05-06 DIAGNOSIS — Z0000 Encounter for general adult medical examination without abnormal findings: Secondary | ICD-10-CM | POA: Diagnosis not present

## 2023-05-13 ENCOUNTER — Other Ambulatory Visit: Payer: Self-pay | Admitting: Family Medicine

## 2023-05-13 DIAGNOSIS — R7302 Impaired glucose tolerance (oral): Secondary | ICD-10-CM | POA: Diagnosis not present

## 2023-05-13 DIAGNOSIS — M8580 Other specified disorders of bone density and structure, unspecified site: Secondary | ICD-10-CM | POA: Diagnosis not present

## 2023-05-13 DIAGNOSIS — Z1231 Encounter for screening mammogram for malignant neoplasm of breast: Secondary | ICD-10-CM | POA: Diagnosis not present

## 2023-05-13 DIAGNOSIS — K219 Gastro-esophageal reflux disease without esophagitis: Secondary | ICD-10-CM | POA: Diagnosis not present

## 2023-05-13 DIAGNOSIS — G540 Brachial plexus disorders: Secondary | ICD-10-CM | POA: Diagnosis not present

## 2023-05-13 DIAGNOSIS — Z0000 Encounter for general adult medical examination without abnormal findings: Secondary | ICD-10-CM | POA: Diagnosis not present

## 2023-05-13 DIAGNOSIS — J392 Other diseases of pharynx: Secondary | ICD-10-CM | POA: Diagnosis not present

## 2023-05-13 DIAGNOSIS — M75121 Complete rotator cuff tear or rupture of right shoulder, not specified as traumatic: Secondary | ICD-10-CM | POA: Diagnosis not present

## 2023-05-13 DIAGNOSIS — J302 Other seasonal allergic rhinitis: Secondary | ICD-10-CM | POA: Diagnosis not present

## 2023-08-04 DIAGNOSIS — L718 Other rosacea: Secondary | ICD-10-CM | POA: Diagnosis not present

## 2023-09-25 DIAGNOSIS — N390 Urinary tract infection, site not specified: Secondary | ICD-10-CM | POA: Diagnosis not present

## 2023-10-20 DIAGNOSIS — R3129 Other microscopic hematuria: Secondary | ICD-10-CM | POA: Diagnosis not present

## 2023-10-20 DIAGNOSIS — R399 Unspecified symptoms and signs involving the genitourinary system: Secondary | ICD-10-CM | POA: Diagnosis not present

## 2023-11-11 DIAGNOSIS — G540 Brachial plexus disorders: Secondary | ICD-10-CM | POA: Diagnosis not present

## 2023-11-11 DIAGNOSIS — R7302 Impaired glucose tolerance (oral): Secondary | ICD-10-CM | POA: Diagnosis not present

## 2023-11-11 DIAGNOSIS — J302 Other seasonal allergic rhinitis: Secondary | ICD-10-CM | POA: Diagnosis not present

## 2023-11-11 DIAGNOSIS — M8580 Other specified disorders of bone density and structure, unspecified site: Secondary | ICD-10-CM | POA: Diagnosis not present

## 2023-11-11 DIAGNOSIS — Z0000 Encounter for general adult medical examination without abnormal findings: Secondary | ICD-10-CM | POA: Diagnosis not present

## 2023-11-11 DIAGNOSIS — M75121 Complete rotator cuff tear or rupture of right shoulder, not specified as traumatic: Secondary | ICD-10-CM | POA: Diagnosis not present

## 2023-11-11 DIAGNOSIS — K219 Gastro-esophageal reflux disease without esophagitis: Secondary | ICD-10-CM | POA: Diagnosis not present

## 2023-12-01 DIAGNOSIS — N390 Urinary tract infection, site not specified: Secondary | ICD-10-CM | POA: Diagnosis not present

## 2023-12-01 DIAGNOSIS — Z1331 Encounter for screening for depression: Secondary | ICD-10-CM | POA: Diagnosis not present

## 2023-12-18 ENCOUNTER — Ambulatory Visit
Admission: RE | Admit: 2023-12-18 | Discharge: 2023-12-18 | Disposition: A | Discharge status: Home / Self Care | Source: Ambulatory Visit | Attending: Family Medicine | Admitting: Family Medicine

## 2023-12-18 DIAGNOSIS — Z1231 Encounter for screening mammogram for malignant neoplasm of breast: Secondary | ICD-10-CM | POA: Diagnosis not present

## 2024-02-05 NOTE — Progress Notes (Signed)
 Tonya Randolph                                          MRN: [id number]   02/05/2024   The [facility] Quality Team Specialist reviewed this patient medical record for the purposes of chart review for care gap closure. The following were reviewed: abstraction for care gap closure-breast cancer screening.    [facility] Quality Team
